# Patient Record
Sex: Female | Born: 1960 | Race: White | Hispanic: No | Marital: Married | Smoking: Never smoker
Health system: Southern US, Community
[De-identification: ages and names within clinical notes are randomized; demographics above are authoritative.]

## PROBLEM LIST (undated history)

## (undated) DIAGNOSIS — G40209 Localization-related (focal) (partial) symptomatic epilepsy and epileptic syndromes with complex partial seizures, not intractable, without status epilepticus: Secondary | ICD-10-CM

## (undated) DIAGNOSIS — M1990 Unspecified osteoarthritis, unspecified site: Secondary | ICD-10-CM

## (undated) DIAGNOSIS — B019 Varicella without complication: Secondary | ICD-10-CM

## (undated) DIAGNOSIS — T7840XA Allergy, unspecified, initial encounter: Secondary | ICD-10-CM

## (undated) DIAGNOSIS — Z9289 Personal history of other medical treatment: Secondary | ICD-10-CM

## (undated) HISTORY — DX: Allergy, unspecified, initial encounter: T78.40XA

## (undated) HISTORY — DX: Varicella without complication: B01.9

## (undated) HISTORY — PX: ABDOMINAL HYSTERECTOMY: SHX81

## (undated) HISTORY — DX: Localization-related (focal) (partial) symptomatic epilepsy and epileptic syndromes with complex partial seizures, not intractable, without status epilepticus: G40.209

## (undated) HISTORY — DX: Personal history of other medical treatment: Z92.89

## (undated) HISTORY — DX: Unspecified osteoarthritis, unspecified site: M19.90

---

## 1992-02-22 HISTORY — PX: OVARIAN CYST REMOVAL: SHX89

## 1998-12-14 ENCOUNTER — Encounter: Payer: Self-pay | Admitting: Obstetrics and Gynecology

## 1998-12-14 ENCOUNTER — Ambulatory Visit (HOSPITAL_COMMUNITY): Admission: RE | Admit: 1998-12-14 | Discharge: 1998-12-14 | Payer: Self-pay | Admitting: Obstetrics and Gynecology

## 2001-12-31 ENCOUNTER — Other Ambulatory Visit: Admission: RE | Admit: 2001-12-31 | Discharge: 2001-12-31 | Payer: Self-pay | Admitting: Obstetrics and Gynecology

## 2003-02-27 ENCOUNTER — Other Ambulatory Visit: Admission: RE | Admit: 2003-02-27 | Discharge: 2003-02-27 | Payer: Self-pay | Admitting: Obstetrics and Gynecology

## 2003-03-05 ENCOUNTER — Ambulatory Visit (HOSPITAL_COMMUNITY): Admission: RE | Admit: 2003-03-05 | Discharge: 2003-03-05 | Payer: Self-pay | Admitting: Obstetrics and Gynecology

## 2004-02-22 HISTORY — PX: PARTIAL HYSTERECTOMY: SHX80

## 2004-02-22 HISTORY — PX: OOPHORECTOMY: SHX86

## 2004-10-07 ENCOUNTER — Other Ambulatory Visit: Admission: RE | Admit: 2004-10-07 | Discharge: 2004-10-07 | Payer: Self-pay | Admitting: Obstetrics and Gynecology

## 2004-11-01 ENCOUNTER — Encounter: Admission: RE | Admit: 2004-11-01 | Discharge: 2004-11-01 | Payer: Self-pay | Admitting: Obstetrics and Gynecology

## 2004-11-11 ENCOUNTER — Encounter: Admission: RE | Admit: 2004-11-11 | Discharge: 2004-11-11 | Payer: Self-pay | Admitting: Obstetrics and Gynecology

## 2005-02-10 ENCOUNTER — Encounter (INDEPENDENT_AMBULATORY_CARE_PROVIDER_SITE_OTHER): Payer: Self-pay | Admitting: Specialist

## 2005-02-10 ENCOUNTER — Inpatient Hospital Stay (HOSPITAL_COMMUNITY): Admission: RE | Admit: 2005-02-10 | Discharge: 2005-02-12 | Payer: Self-pay | Admitting: Obstetrics and Gynecology

## 2005-10-18 ENCOUNTER — Other Ambulatory Visit: Admission: RE | Admit: 2005-10-18 | Discharge: 2005-10-18 | Payer: Self-pay | Admitting: Obstetrics and Gynecology

## 2005-10-26 ENCOUNTER — Ambulatory Visit (HOSPITAL_COMMUNITY): Admission: RE | Admit: 2005-10-26 | Discharge: 2005-10-26 | Payer: Self-pay | Admitting: Obstetrics and Gynecology

## 2006-11-16 ENCOUNTER — Ambulatory Visit (HOSPITAL_COMMUNITY): Admission: RE | Admit: 2006-11-16 | Discharge: 2006-11-16 | Payer: Self-pay | Admitting: Obstetrics and Gynecology

## 2008-07-30 ENCOUNTER — Encounter: Admission: RE | Admit: 2008-07-30 | Discharge: 2008-07-30 | Payer: Self-pay | Admitting: Neurology

## 2008-12-10 ENCOUNTER — Ambulatory Visit (HOSPITAL_COMMUNITY): Admission: RE | Admit: 2008-12-10 | Discharge: 2008-12-10 | Payer: Self-pay | Admitting: Interventional Cardiology

## 2008-12-10 ENCOUNTER — Encounter (INDEPENDENT_AMBULATORY_CARE_PROVIDER_SITE_OTHER): Payer: Self-pay | Admitting: Interventional Cardiology

## 2008-12-10 DIAGNOSIS — Z9289 Personal history of other medical treatment: Secondary | ICD-10-CM

## 2008-12-10 HISTORY — DX: Personal history of other medical treatment: Z92.89

## 2009-03-24 ENCOUNTER — Ambulatory Visit (HOSPITAL_COMMUNITY): Admission: RE | Admit: 2009-03-24 | Discharge: 2009-03-24 | Payer: Self-pay | Admitting: Family Medicine

## 2010-07-09 NOTE — Op Note (Signed)
NAMEAVEN, CEGIELSKI              ACCOUNT NO.:  [id number]   MEDICAL RECORD NO.:  [id number]          PATIENT TYPE:  INP   LOCATION:  9399                          FACILITY:  [facility]   PHYSICIAN:  Janine Limbo, M.D.DATE OF BIRTH:  [birth year]   DATE OF PROCEDURE:  DATE OF DISCHARGE:                                 OPERATIVE REPORT   PREOPERATIVE DIAGNOSES:  1.  A 20-week size fibroid uterus.  2.  Anemia (hemoglobin 11.2).  3.  Abdominal pain.  4.  Questionable right endometrioma.   POSTOPERATIVE DIAGNOSES:  1.  A 16- to 18-week size fibroid uterus.  2.  Anemia (hemoglobin 11.2).  3.  Abdominal pain.  4.  Endometrioma.   PROCEDURE:  1.  Total abdominal hysterectomy.  2.  Right salpingo-oophorectomy.  3.  Left partial salpingectomy.  4.  Ablation of endometriosis.   SURGEON:  Janine Limbo, M.D.   FIRST ASSISTANT:  Hal Morales, M.D.   ANESTHESIA:  General.   DISPOSITION:  Ms. Branscum is a 50 year old female, para 1-0-0-1, who  presents for the above-mentioned diagnoses.  She understands the indications  for her surgical procedure and she accepts the risk of, but not limited to  anesthetic complications, bleeding, infection, and possible damage to the  surrounding organs.   FINDINGS:  The uterus was 59- to 18-week size and contained multiple  fibroids.  There was an 8 cm endometrioma present on right ovary.  There was  extensive endometriosis present on the right fallopian tube, in the  posterior cul-de-sac, particularly along the right uterosacral ligament, in  the anterior cul-de-sac, at the level of the bladder flap, on the left  fallopian tube, and on the left ovary.  There were very small areas of  endometriosis measuring less than 0.2 cm in size on the colon.  The weight  of the uterus was 851 grams.  The left ovary otherwise appeared normal.   DESCRIPTION OF PROCEDURE:  The patient was taken to the operating room where  a general anesthetic was  given.  The patient's abdomen, perineum, and vagina  were prepped with multiple layers of Betadine.  A Foley catheter was placed  in the bladder.  The patient was then sterilely draped.  The midline  incision that was previously made was injected with 0.5% Marcaine with  epinephrine.  A total of 10 mL was used.  An incision was made in the lower  abdomen to the level of the umbilicus.  The prior scar was removed.  The  incision was extended sharply through the subcutaneous tissue, the fascia  and the anterior peritoneum.  An abdominal wall retractor was placed.  The  bowel was packed cephalad.  The uterus was elevated into the operative  field.  The round ligaments were clamped bilaterally, suture ligated, cut,  and then held to the side.  The upper pedicles were skeletonized  bilaterally, clamped, cut, free tied, and suture ligated.  The bladder flap  was developed anteriorly.  Care was taken to remove the endometriosis  present in the anterior cul-de-sac, but not to damage the bladder.  The  uterine arteries were skeletonized bilaterally.  Alternating from left to  right, the uterine arteries, parametrial tissues, paracervical tissues, and  uterosacral ligaments were clamped, cut, sutured and tied securely.  The  ureters had previously been identified and followed throughout their course  in the pelvis.  There was no evidence that the ureters would be damaged as  we did our dissection.  There was no evidence of damage to the ureters  either.  The cervix was then transected from the apex of the vagina.  The  uterus was removed from the operative field.  The apex of the vagina was  closed using figure-of-eight sutures with care to include the vaginal  mucosa.  The pelvis was irrigated.  Hemostasis was achieved using free ties.  We then skeletonized the right infundibulopelvic ligament.  Again, care was  taken not to damage the ureter.  The infundibulopelvic ligament was doubly  clamped,  cut, free tied, and suture ligated.  At this point, hemostasis was  noted to be adequate throughout.  The endometriosis that was present on the  right uterosacral ligament was then partially transected, but also partially  ablated.  Again, care was taken not to damage the ureter.  There was  endometriosis present on the left fallopian tube and therefore, the left  fallopian tube was surgically removed.  The mesosalpinx was sutured using #0  Vicryl.  There was a small area of endometriosis present on the left ovary  and this was ablated.  At this point, there were no other signs of  endometriosis present in the pelvis except for a 0.2 cm area on the bowel.  The decision was made not to ablate or resect this area.  A final check was  made for hemostasis and again, hemostasis was confirmed.  The uterosacral  ligaments were tied to the vaginal angles.  All instruments were removed.  Sponge, needle and instrument counts were correct.  The anterior peritoneum,  the abdominal musculature, and the fascia were closed using a modified Smead-  Jones suture of #0 PDS.  The subcutaneous layer was irrigated.  Hemostasis  was adequate.  The subcutaneous layer was closed using #0 Vicryl.  The skin  was reapproximated using a subcuticular suture of 4-0 Monocryl.  #0 Vicryl  was used except where otherwise mentioned.  Sponge, needle and instrument  counts were correct for a final time.  The estimated blood loss was 75 mL.  The patient tolerated her procedure well.  She was noted to drain clear,  yellow urine at the end of her procedure.  The patient received Toradol 30  mg IV during the operative procedure.  She was to receive antibiotics  preoperatively per protocol.      Janine Limbo, M.D.  Electronically Signed     AVS/MEDQ  D:  02/10/2005  T:  02/10/2005  Job:  [id number]

## 2010-07-09 NOTE — H&P (Signed)
[name], [name]              ACCOUNT NO.:  [id number]   MEDICAL RECORD NO.:  [id number]          PATIENT TYPE:  INP   LOCATION:  NA                            FACILITY:  [facility]   PHYSICIAN:  Janine Limbo, M.D.DATE OF BIRTH:  27-Jan-1961   DATE OF ADMISSION:  DATE OF DISCHARGE:                                HISTORY & PHYSICAL   DATE OF SURGERY:  February 10, 2005   HISTORY OF PRESENT ILLNESS:  [name] is a 50 year old female, para 1-0-  0-1, who presents for a total abdominal hysterectomy and possible right  salpingo-oophorectomy.  The patient has been followed at the [facility] and Gynecology Division of [facility].  She has a known history of large fibroids.  She had an MRI and a CT  scan done of the pelvis that showed a 15.3 x 13.8 cm uterus with multiple  large fibroids.  The largest fibroid measured 7.3 cm by 6.7 cm.  In  addition, it showed a 6.9 x 7.4 cm mass in the right ovary that was most  consistent with an endometrioma.  The patient has a known history of severe  anemia in spite of iron therapy.  She also has pelvic pressure symptoms.  At  this point, she is ready to proceed with definitive therapy.  The patient's  most recent Pap smear was within normal limits and it dates to August of  2006.  The patient has had an endometrial biopsy in the past that was within  normal limits.   OBSTETRIC HISTORY:  The patient had a vaginal delivery in 1994 at term.   PAST MEDICAL HISTORY:  The patient has a history of seizures and she  currently takes Phenobarbital 64.8 mg tablets (two tablets at bedtime).  She  also takes iron and multivitamins.   DRUG ALLERGIES:  NO KNOWN DRUG ALLERGIES.   REVIEW OF SYSTEMS:  Please see history of present illness.   SOCIAL HISTORY:  The patient denies cigarette use, alcohol use, and  recreational drug use.   PHYSICAL EXAMINATION:  VITAL SIGNS:  Weight is 131 pounds.  Height is 5 feet  5  inches.  HEENT:  Within normal limits.  CHEST:  Clear.  HEART:  Regular rate and rhythm.  BREASTS:  Without masses.  ABDOMEN:  Nontender.  There is a firm mass present to the level of the  umbilicus.  EXTREMITIES:  Within normal limits.  NEUROLOGIC EXAMINATION:  Grossly normal.  PELVIC EXAMINATION:  External genitalia is normal.  The vagina is normal.  Cervix is non-tender.  The uterus is 20 weeks' size and non-tender.  Adnexa:  No masses are appreciated.  Rectovaginal exam confirms.   ASSESSMENT:  1.  Large fibroids.  2.  Anemia.  3.  Abdominal pain.  4.  Questionable endometrioma of the right ovary.   PLAN:  The patient will undergo a total abdominal hysterectomy and possible  right salpingo-oophorectomy.  The patient understands the indications for  her surgical procedure, and she accepts the risk of, but not limited to,  anesthetic complications, bleeding, infections,  and possible damage to the  surrounding organs.      Janine Limbo, M.D.  Electronically Signed     AVS/MEDQ  D:  02/07/2005  T:  02/07/2005  Job:  [id number]   cc:   Molly Maduro L. Foy [place], M.D.  Fax: [phone]

## 2010-07-09 NOTE — Discharge Summary (Signed)
**Note Amanda via Obfuscation** Allison, [name]              ACCOUNT NO.:  [id number]   MEDICAL RECORD NO.:  [id number]          PATIENT TYPE:  INP   LOCATION:  9306                          [facility]ACILITY:  [facility]   PHYSICIAN:  Janine Limbo, M.D.DATE OF BIRTH:  Feb 18, 1961   DATE OF ADMISSION:  02/10/2005  DATE OF DISCHARGE:  02/12/2005                                 DISCHARGE SUMMARY   ADMISSION DIAGNOSIS:  1.  Large fibroids.  2.  Anemia.  3.  Abdominal pain.  4.  Questionable endometrioma of the right ovary.   DISCHARGE DIAGNOSIS:  1.  16-18 weeks size fibroid uterus.  2.  Anemia (hemoglobin is 9.4).  3.  Abdominal pain.  4.  Endometrioma of the right ovary.  5.  Extensive endometriosis.   PROCEDURES THIS ADMISSION:  02/10/2005.  1.  Total abdominal hysterectomy.  2.  Right salpingo-oophorectomy.  3.  Left partial salpingectomy.  4.  Ablation of endometriosis.   HISTORY OF PRESENT ILLNESS:  [name] is a 50 year old female who  presents with large fibroids and increasing abdominal pain. The patient has  had heavy bleeding with hemoglobin as low as 8.6. Please see her dictated  history and physical exam for details.   ADMISSION EXAM:  The patient was noted to have a 20-week size fibroid  uterus. A mass had previously been noted in the right pelvis and this was  thought to either be a fibroid or endometrioma.   HOSPITAL COURSE:  On the day of admission, the patient was taken to the  operating room. Operative findings included a 16 18-week size multifibroid  uterus. There was an 8 cm endometrioma present in the right ovary. There was  extensive endometriosis present on the fallopian tubes, in the posterior cul-  de-sac, along the right uterosacral ligament, in the anterior cul-de-sac, on  the left fallopian tube, and on left ovary. The patient's uterus was removed  and the areas of endometriosis were either removed or ablated. The patient  tolerated her procedure well. The patient's  postoperative course was  complicated by a slow return of bowel function. She did tolerate a regular  diet by postoperative day #2, however. She was able to pass flatus by  postoperative day #2. The patient had a temperature to 100.5 on 02/11/2005  at 10 o'clock a.m. However, she quickly defervesced and she remained  afebrile throughout the remainder of her postoperative state. She had  adequate pain control with pain medicines by mouth. The decision was made  that she was an appropriate candidate for discharge.   DISCHARGE INSTRUCTIONS:  The patient will return to see Dr. Stefano Gaul in 6  weeks for follow-up examination. She will refrain from driving for 2 weeks,  heavy lifting for 4 weeks, and intercourse for 6 weeks. She will call for  temperature greater than 100.4 or for severe problems. She was given a copy  of the postoperative instruction sheet as prepared by [facility], division of [facility]. She was given a  prescription for Vicodin and she will take one or two tablets every 4 hours  as needed for pain. She can also take ibuprofen 600 milligrams every 6 hours  as needed for pain. She will take iron 325 milligrams 1 tablet twice each  day for 6 weeks.      Janine Limbo, M.D.  Electronically Signed     AVS/MEDQ  D:  02/12/2005  T:  02/14/2005  Job:  [id number]

## 2012-03-20 ENCOUNTER — Other Ambulatory Visit: Payer: Self-pay | Admitting: Family Medicine

## 2012-03-20 DIAGNOSIS — Z1231 Encounter for screening mammogram for malignant neoplasm of breast: Secondary | ICD-10-CM

## 2012-04-11 ENCOUNTER — Ambulatory Visit
Admission: RE | Admit: 2012-04-11 | Discharge: 2012-04-11 | Disposition: A | Discharge status: Home / Self Care | Payer: Commercial Indemnity | Source: Ambulatory Visit | Attending: Family Medicine | Admitting: Family Medicine

## 2012-04-11 DIAGNOSIS — Z1231 Encounter for screening mammogram for malignant neoplasm of breast: Secondary | ICD-10-CM

## 2012-06-03 ENCOUNTER — Emergency Department (HOSPITAL_BASED_OUTPATIENT_CLINIC_OR_DEPARTMENT_OTHER)
Admission: EM | Admit: 2012-06-03 | Discharge: 2012-06-04 | Disposition: A | Discharge status: Home / Self Care | Payer: Commercial Indemnity | Attending: Emergency Medicine | Admitting: Emergency Medicine

## 2012-06-03 ENCOUNTER — Encounter (HOSPITAL_BASED_OUTPATIENT_CLINIC_OR_DEPARTMENT_OTHER): Payer: Self-pay | Admitting: *Deleted

## 2012-06-03 ENCOUNTER — Other Ambulatory Visit: Payer: Self-pay

## 2012-06-03 DIAGNOSIS — R112 Nausea with vomiting, unspecified: Secondary | ICD-10-CM | POA: Insufficient documentation

## 2012-06-03 DIAGNOSIS — R197 Diarrhea, unspecified: Secondary | ICD-10-CM | POA: Insufficient documentation

## 2012-06-03 DIAGNOSIS — Z7982 Long term (current) use of aspirin: Secondary | ICD-10-CM | POA: Insufficient documentation

## 2012-06-03 DIAGNOSIS — Z79899 Other long term (current) drug therapy: Secondary | ICD-10-CM | POA: Insufficient documentation

## 2012-06-03 DIAGNOSIS — Z8669 Personal history of other diseases of the nervous system and sense organs: Secondary | ICD-10-CM | POA: Insufficient documentation

## 2012-06-03 DIAGNOSIS — R109 Unspecified abdominal pain: Secondary | ICD-10-CM

## 2012-06-03 DIAGNOSIS — R748 Abnormal levels of other serum enzymes: Secondary | ICD-10-CM

## 2012-06-03 NOTE — ED Notes (Addendum)
Pt c/o abd pain, N/V/D onset earlier today. Actively vomiting at triage. States has been on antibiotic since Friday and thought that might be the problem. Points to epigastric region and states pain goes through to back. Also c/o dizziness. EKG being done at triage.

## 2012-06-03 NOTE — ED Notes (Signed)
I placed patient on wall monitor after getting portable ECG. Pa Sophia viewed ECG before i left copy on Dr.'s desk.

## 2012-06-04 ENCOUNTER — Encounter (HOSPITAL_BASED_OUTPATIENT_CLINIC_OR_DEPARTMENT_OTHER): Payer: Self-pay

## 2012-06-04 ENCOUNTER — Emergency Department (HOSPITAL_BASED_OUTPATIENT_CLINIC_OR_DEPARTMENT_OTHER): Payer: Commercial Indemnity

## 2012-06-04 LAB — COMPREHENSIVE METABOLIC PANEL
ALT: 155 U/L — ABNORMAL HIGH (ref 0–35)
AST: 209 U/L — ABNORMAL HIGH (ref 0–37)
Albumin: 4.1 g/dL (ref 3.5–5.2)
Alkaline Phosphatase: 132 U/L — ABNORMAL HIGH (ref 39–117)
CO2: 28 mEq/L (ref 19–32)
Chloride: 103 mEq/L (ref 96–112)
GFR calc non Af Amer: >90 mL/min (ref >90)
Potassium: 4.1 mEq/L (ref 3.5–5.1)
Sodium: 139 mEq/L (ref 135–145)
Total Bilirubin: 0.5 mg/dL (ref 0.3–1.2)

## 2012-06-04 LAB — CBC WITH DIFFERENTIAL/PLATELET
Basophils Absolute: 0 10*3/uL (ref 0.0–0.1)
Basophils Relative: 0 % (ref 0–1)
HCT: 38.5 % (ref 36.0–46.0)
Lymphocytes Relative: 4 % — ABNORMAL LOW (ref 12–46)
MCHC: 34 g/dL (ref 30.0–36.0)
Neutro Abs: 12.2 10*3/uL — ABNORMAL HIGH (ref 1.7–7.7)
Neutrophils Relative %: 92 % — ABNORMAL HIGH (ref 43–77)
RDW: 12.4 % (ref 11.5–15.5)
WBC: 13.3 10*3/uL — ABNORMAL HIGH (ref 4.0–10.5)

## 2012-06-04 MED ORDER — OXYCODONE-ACETAMINOPHEN 5-325 MG PO TABS: 1.0000 | ORAL_TABLET | Freq: Four times a day (QID) | ORAL | Status: DC | PRN

## 2012-06-04 MED ORDER — SODIUM CHLORIDE 0.9 % IV BOLUS (SEPSIS)
500.0000 mL | Freq: Once | INTRAVENOUS | Status: AC
  Administered 2012-06-04: 500 mL via INTRAVENOUS

## 2012-06-04 MED ORDER — MORPHINE SULFATE 4 MG/ML IJ SOLN
4.0000 mg | Freq: Once | INTRAMUSCULAR | Status: AC
  Administered 2012-06-04: 4 mg via INTRAVENOUS
  Filled 2012-06-04: qty 1

## 2012-06-04 MED ORDER — ONDANSETRON HCL 4 MG/2ML IJ SOLN
4.0000 mg | Freq: Once | INTRAMUSCULAR | Status: AC
  Administered 2012-06-04: 4 mg via INTRAVENOUS
  Filled 2012-06-04: qty 2

## 2012-06-04 MED ORDER — IOHEXOL 300 MG/ML  SOLN
50.0000 mL | Freq: Once | INTRAMUSCULAR | Status: AC | PRN
  Administered 2012-06-04: 50 mL via ORAL

## 2012-06-04 MED ORDER — IOHEXOL 300 MG/ML  SOLN
100.0000 mL | Freq: Once | INTRAMUSCULAR | Status: AC | PRN
  Administered 2012-06-04: 100 mL via INTRAVENOUS

## 2012-06-04 MED ORDER — ONDANSETRON 8 MG PO TBDP: 8.0000 mg | ORAL_TABLET | Freq: Three times a day (TID) | ORAL | Status: DC | PRN

## 2012-06-04 MED ORDER — FENTANYL CITRATE 0.05 MG/ML IJ SOLN
100.0000 ug | Freq: Once | INTRAMUSCULAR | Status: AC
  Administered 2012-06-04: 100 ug via INTRAVENOUS
  Filled 2012-06-04: qty 2

## 2012-06-04 NOTE — Discharge Instructions (Signed)
Follow with your doctor to get a dynamic liver protocol MRI. You also have cysts on your left ovary and that will need an ultrasound.  Abdominal Pain Abdominal pain can be caused by many things. Your caregiver decides the seriousness of your pain by an examination and possibly blood tests and X-rays. Many cases can be observed and treated at home. Most abdominal pain is not caused by a disease and will probably improve without treatment. However, in many cases, more time must pass before a clear cause of the pain can be found. Before that point, it may not be known if you need more testing, or if hospitalization or surgery is needed. HOME CARE INSTRUCTIONS   Do not take laxatives unless directed by your caregiver.  Take pain medicine only as directed by your caregiver.  Only take over-the-counter or prescription medicines for pain, discomfort, or fever as directed by your caregiver.  Try a clear liquid diet (broth, tea, or water) for as long as directed by your caregiver. Slowly move to a bland diet as tolerated. SEEK IMMEDIATE MEDICAL CARE IF:   The pain does not go away.  You have a fever.  You keep throwing up (vomiting).  The pain is felt only in portions of the abdomen. Pain in the right side could possibly be appendicitis. In an adult, pain in the left lower portion of the abdomen could be colitis or diverticulitis.  You pass bloody or black tarry stools. MAKE SURE YOU:   Understand these instructions.  Will watch your condition.  Will get help right away if you are not doing well or get worse. Document Released: 11/17/2004 Document Revised: 05/02/2011 Document Reviewed: 09/26/2007 [facility] Patient Information 2013 [place], [place].

## 2012-06-04 NOTE — ED Notes (Signed)
Patient transported to CT 

## 2012-06-04 NOTE — ED Notes (Signed)
MD at bedside with bedside ultrasound machine

## 2012-06-04 NOTE — ED Provider Notes (Signed)
History     CSN: [id number]  Arrival date & time 06/03/12  2334   First MD Initiated Contact with Patient 06/03/12 2359      Chief Complaint  Patient presents with  . Abdominal Pain    (Consider location/radiation/quality/duration/timing/severity/associated sxs/prior treatment) Patient is a 52 y.o. female presenting with abdominal pain. The history is provided by the patient.  Abdominal Pain Associated symptoms: diarrhea, nausea and vomiting   Associated symptoms: no chest pain and no shortness of breath    patient resents with upper abdominal pain. It is dull and somewhat crampy. She's also had nausea and vomiting with some diarrhea. No fevers. She's had a decreased appetite. She has several family members that have had GI symptoms, however it was approximately 3 weeks ago. No lightheadedness. States the pain is dull and does radiate to her chest a little bit. It began today. The pain is constant patient has been on Augmentin since Friday for a sinus infection. She states her sinus pressure has been there for 3 weeks but has improved.  Past Medical History  Diagnosis Date  . Seizures     Past Surgical History  Procedure Laterality Date  . Abdominal hysterectomy      History reviewed. No pertinent family history.  History  Substance Use Topics  . Smoking status: Never Smoker   . Smokeless tobacco: Not on file  . Alcohol Use: No    OB History   Grav Para Term Preterm Abortions TAB SAB Ect Mult Living                  Review of Systems  Constitutional: Positive for appetite change. Negative for activity change.  HENT: Positive for sinus pressure. Negative for neck stiffness.   Eyes: Negative for pain.  Respiratory: Negative for chest tightness and shortness of breath.   Cardiovascular: Negative for chest pain and leg swelling.  Gastrointestinal: Positive for nausea, vomiting, abdominal pain and diarrhea.  Genitourinary: Negative for flank pain.  Musculoskeletal:  Negative for back pain.  Skin: Negative for rash.  Neurological: Negative for weakness, numbness and headaches.  Psychiatric/Behavioral: Negative for behavioral problems.    Allergies  Review of patient's allergies indicates no known allergies.  Home Medications   Current Outpatient Rx  Name  Route  Sig  Dispense  Refill  . amoxicillin-clavulanate (AUGMENTIN) 875-125 MG per tablet   Oral   Take 1 tablet by mouth 2 (two) times daily.         Marland Kitchen aspirin 81 MG tablet   Oral   Take 81 mg by mouth daily.         Marland Kitchen PHENobarbital (LUMINAL) 64.8 MG tablet   Oral   Take 64.8 mg by mouth 2 (two) times daily.         . ondansetron (ZOFRAN-ODT) 8 MG disintegrating tablet   Oral   Take 1 tablet (8 mg total) by mouth every 8 (eight) hours as needed for nausea.   20 tablet   0   . oxyCODONE-acetaminophen (PERCOCET/ROXICET) 5-325 MG per tablet   Oral   Take 1-2 tablets by mouth every 6 (six) hours as needed for pain.   15 tablet   0     BP 116/65  Pulse 106  Temp(Src) 98.3 F (36.8 C) (Oral)  Resp 16  Ht 5\' 4"  (1.626 m)  Wt 142 lb (64.411 kg)  BMI 24.36 kg/m2  SpO2 100%  Physical Exam  Nursing note and vitals reviewed. Constitutional: She is oriented  to person, place, and time. She appears well-developed and well-nourished.  HENT:  Head: Normocephalic and atraumatic.  Eyes: EOM are normal. Pupils are equal, round, and reactive to light.  Neck: Normal range of motion. Neck supple.  Cardiovascular: Regular rhythm and normal heart sounds.   No murmur heard. Tachycardia  Pulmonary/Chest: Effort normal and breath sounds normal. No respiratory distress. She has no wheezes. She has no rales.  Abdominal: Soft. Bowel sounds are normal. She exhibits no distension. There is tenderness. There is no rebound and no guarding.  Upper abdominal tenderness. Mild firmness. No distention.  Musculoskeletal: Normal range of motion.  Neurological: She is alert and oriented to person,  place, and time. No cranial nerve deficit.  Skin: Skin is warm and dry.  Psychiatric: She has a normal mood and affect. Her speech is normal.    ED Course  Procedures (including critical care time)  Labs Reviewed  CBC WITH DIFFERENTIAL - Abnormal; Notable for the following:    WBC 13.3 (*)    Neutrophils Relative 92 (*)    Neutro Abs 12.2 (*)    Lymphocytes Relative 4 (*)    Lymphs Abs 0.5 (*)    All other components within normal limits  COMPREHENSIVE METABOLIC PANEL - Abnormal; Notable for the following:    Glucose, Bld 159 (*)    AST 209 (*)    ALT 155 (*)    Alkaline Phosphatase 132 (*)    All other components within normal limits  LIPASE, BLOOD   Ct Abdomen Pelvis W Contrast  06/04/2012  *RADIOLOGY REPORT*  Clinical Data: Abdominal pain, nausea, vomiting and diarrhea. Elevated LFTs.  Leukocytosis.  CT ABDOMEN AND PELVIS WITH CONTRAST  Technique:  Multidetector CT imaging of the abdomen and pelvis was performed following the standard protocol during bolus administration of intravenous contrast.  Contrast: 100 mL of Omnipaque 300 IV contrast  Comparison: CT of the pelvis performed 11/01/2004, MRI of the pelvis performed 11/11/2004 and abdominal ultrasound performed 03/28/2012  Findings: The visualized lung bases are clear.  A vague 3.0 cm focus of decreased attenuation is noted within the anterior right hepatic lobe adjacent to the gallbladder fossa.  The appearance is somewhat atypical for fatty infiltration, and a focal mass cannot be excluded.  The liver is otherwise grossly unremarkable in appearance.  The spleen is within normal limits. An apparent large 1.9 cm stone is noted within the gallbladder; the gallbladder is otherwise unremarkable in appearance.  The pancreas and adrenal glands are unremarkable.  Slightly prominent nodes are noted about the hepatic hilum.  The kidneys are unremarkable in appearance.  There is no evidence of hydronephrosis.  No renal or ureteral stones are  seen.  No perinephric stranding is appreciated.  No free fluid is identified.  The small bowel is unremarkable in appearance.  The stomach is within normal limits.  No acute vascular abnormalities are seen.  The appendix is normal in caliber, without evidence for appendicitis.  The colon is grossly unremarkable in appearance.  The bladder is mildly distended and grossly unremarkable.  The patient is status post hysterectomy.  Scattered cystic lesions are noted about the left ovary, measuring up to 6.8 x 3.1 cm in size. No suspicious adnexal masses are seen.  No inguinal lymphadenopathy is seen.  No acute osseous abnormalities are identified.  IMPRESSION:  1.  Vague 3.0 cm focus of decreased attenuation within the anterior right hepatic lobe adjacent to the gallbladder fossa.  The appearance is somewhat atypical for fatty  infiltration, and a focal mass cannot be excluded.  Dynamic liver protocol MRI or CT would be helpful for further evaluation; an MRI could be performed when the patient is able to hold her breath for the study. 2.  Large stone again noted within the gallbladder; gallbladder otherwise unremarkable in appearance. 3.  Slightly prominent nodes about the hepatic hilum, of uncertain significance. 4.  Scattered cystic lesions about the left ovary, measuring up to 6.8 x 3.1 cm in size.  Though these may be physiologic, pelvic ultrasound is recommended for further evaluation on an elective non- emergent basis.   Original Report Authenticated By: Tonia Ghent, M.D.    Dg Abd Acute W/chest  06/04/2012  *RADIOLOGY REPORT*  Clinical Data: Abdominal pain.  Nausea, vomiting, and diarrhea.  ACUTE ABDOMEN SERIES (ABDOMEN 2 VIEW & CHEST 1 VIEW)  Comparison: None.  Findings: Heart size and pulmonary vascularity are normal.  Slight scarring at the lung apices.  Lungs are otherwise clear.  No free air in the abdomen.  No dilated loops of large or small bowel.  No osseous abnormality.  IMPRESSION: Benign-appearing  abdomen and chest.   Original Report Authenticated By: Francene Boyers, M.D.      1. Abdominal pain   2. Elevated liver enzymes      Date: 06/04/2012  Rate: 122  Rhythm: sinus tachycardia  QRS Axis: normal  Intervals: normal  ST/T Wave abnormalities: normal  Conduction Disutrbances:left anterior fascicular block  Narrative Interpretation:   Old EKG Reviewed: none available    MDM  Patient presents with nausea vomiting diarrhea and abdominal pain. Had a recent ultrasound 2 months ago that showed gallstones. LFTs are elevated mildly. CT scan shows possible lesion in the liver. Patient feels better as tolerated orals here. She has a primary care Dr. to followup for an outpatient MRI. She also has cyst on ovary on the day of all by ultrasound. Patient begin pain medicines and antiemetics and discharged home        Juliet Rude. Rubin Payor, MD 06/04/12 [phone]

## 2012-06-12 ENCOUNTER — Other Ambulatory Visit: Payer: Self-pay | Admitting: Family Medicine

## 2012-06-12 DIAGNOSIS — R935 Abnormal findings on diagnostic imaging of other abdominal regions, including retroperitoneum: Secondary | ICD-10-CM

## 2012-06-15 ENCOUNTER — Ambulatory Visit
Admission: RE | Admit: 2012-06-15 | Discharge: 2012-06-15 | Disposition: A | Discharge status: Home / Self Care | Payer: Commercial Indemnity | Source: Ambulatory Visit | Attending: Family Medicine | Admitting: Family Medicine

## 2012-06-15 DIAGNOSIS — R935 Abnormal findings on diagnostic imaging of other abdominal regions, including retroperitoneum: Secondary | ICD-10-CM

## 2012-06-15 MED ORDER — GADOBENATE DIMEGLUMINE 529 MG/ML IV SOLN
13.0000 mL | Freq: Once | INTRAVENOUS | Status: AC | PRN
  Administered 2012-06-15: 13 mL via INTRAVENOUS

## 2012-08-27 ENCOUNTER — Other Ambulatory Visit: Payer: Self-pay | Admitting: Neurology

## 2012-11-23 ENCOUNTER — Ambulatory Visit (INDEPENDENT_AMBULATORY_CARE_PROVIDER_SITE_OTHER): Payer: Managed Care, Other (non HMO) | Admitting: Nurse Practitioner

## 2012-11-23 ENCOUNTER — Encounter: Payer: Self-pay | Admitting: Nurse Practitioner

## 2012-11-23 VITALS — BP 120/71 | HR 96 | Ht 65.0 in | Wt 140.0 lb

## 2012-11-23 DIAGNOSIS — Z79899 Other long term (current) drug therapy: Secondary | ICD-10-CM

## 2012-11-23 DIAGNOSIS — Z5181 Encounter for therapeutic drug level monitoring: Secondary | ICD-10-CM | POA: Insufficient documentation

## 2012-11-23 DIAGNOSIS — G40109 Localization-related (focal) (partial) symptomatic epilepsy and epileptic syndromes with simple partial seizures, not intractable, without status epilepticus: Secondary | ICD-10-CM

## 2012-11-23 MED ORDER — PHENOBARBITAL 64.8 MG PO TABS: ORAL_TABLET | ORAL | Status: DC

## 2012-11-23 NOTE — Patient Instructions (Addendum)
Will check labs today Continue phenobarbital dose Followup yearly and when necessary Call for any seizure activity

## 2012-11-23 NOTE — Progress Notes (Signed)
I have read the note, and I agree with the clinical assessment and plan.  WILLIS,CHARLES KEITH   

## 2012-11-23 NOTE — Progress Notes (Signed)
[facility]  PATIENT: [name] DOB: [birth year]   REASON FOR VISIT: Followup seizure disorder   HISTORY OF PRESENT ILLNESS: [name] ,52 year old white female returns today for followup. 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 She has been well-controlled on phenobarbital. She denies any side effects to the medication. Appetite is reportedly good, sleeping well, no falls. She has a history of osteoarthritis of the knees particularly the left. She also had a hysterectomy a couple of years ago, she had a DEXA scan (2010) which returned normal. She intermittently exercises, in the winter she does nothing, in the spring and summer she is much more active . No seizure activity in many  years. She was seen in the ER back in the spring for abdominal pain, ultrasound detected a spot on her liver and she had elevated enzymes at that time. She claims repeat ultrasound done and the spot had decreased in size. She has no new neurologic complaints.  REVIEW OF SYSTEMS: Full 14 system review of systems performed and notable only for:  Constitutional: N/A  Cardiovascular: N/A  Ear/Nose/Throat: N/A  Skin: N/A  Eyes: N/A  Respiratory: N/A  Gastroitestinal: N/A  Hematology/Lymphatic: N/A  Endocrine: N/A Musculoskeletal:N/A  Allergy/Immunology: N/A  Neurological: N/A Psychiatric: Anxiety, decreased energy  ALLERGIES: No Known Allergies  HOME MEDICATIONS: Outpatient Prescriptions Prior to Visit  Medication Sig Dispense Refill  . aspirin 81 MG tablet Take 81 mg by mouth daily.      Marland Kitchen PHENobarbital (LUMINAL) 64.8 MG tablet TAKE 2 TABLETS BY MOUTH EVERY NIGHT AT BEDTIME  60 tablet  3  . amoxicillin-clavulanate (AUGMENTIN) 875-125 MG per tablet Take 1 tablet by mouth 2 (two) times daily.      . ondansetron (ZOFRAN-ODT) 8 MG disintegrating tablet Take 1 tablet (8 mg total) by mouth every 8 (eight) hours as needed for nausea.  20 tablet  0  .  oxyCODONE-acetaminophen (PERCOCET/ROXICET) 5-325 MG per tablet Take 1-2 tablets by mouth every 6 (six) hours as needed for pain.  15 tablet  0   No facility-administered medications prior to visit.    PAST MEDICAL HISTORY: Past Medical History  Diagnosis Date  . Seizures     PAST SURGICAL HISTORY: Past Surgical History  Procedure Laterality Date  . Abdominal hysterectomy      FAMILY HISTORY: History reviewed. No pertinent family history.  SOCIAL HISTORY: History   Social History  . Marital Status: Married    Spouse Name: N/A    Number of Children: 1  . Years of Education: hs   Occupational History  . Not on file.   Social History Main Topics  . Smoking status: Never Smoker   . Smokeless tobacco: Never Used  . Alcohol Use: No  . Drug Use: No  . Sexual Activity: Not on file   Other Topics Concern  . Not on file   Social History Narrative   Patient lives at home with husband [name].    Patient has 1 child.    Patient has a high school education.            PHYSICAL EXAM  Filed Vitals:   11/23/12 1008  BP: 120/71  Pulse: 96  Height: 5\' 5"  (1.651 m)  Weight: 140 lb (63.504 kg)   Body mass index is 23.3 kg/(m^2).  Generalized: Well developed, in no acute distress  Head: normocephalic and atraumatic,. Oropharynx benign  Neck: Supple, no carotid bruits  Cardiac: Regular rate rhythm,  no murmur    Neurological examination   Mentation: Alert oriented to time, place, history taking. Follows all commands speech and language fluent  Cranial nerve II-XII: Pupils were equal round reactive to light extraocular movements were full, visual field were full on confrontational test. Facial sensation and strength were normal. hearing was intact to finger rubbing bilaterally. Uvula tongue midline. head turning and shoulder shrug and were normal and symmetric.Tongue protrusion into cheek strength was normal. Motor: normal bulk and tone, full strength in the BUE, BLE,  fine finger movements normal, no pronator drift. No focal weakness Coordination: finger-nose-finger, heel-to-shin bilaterally, no dysmetria Reflexes: Brachioradialis 2/2, biceps 2/2, triceps 2/2, patellar 2/2, Achilles 2/2, plantar responses were flexor bilaterally. Gait and Station: Rising up from seated position without assistance, normal stance,  moderate stride, good arm swing, smooth turning, able to perform tiptoe, and heel walking without difficulty. Tandem gait stable  DIAGNOSTIC DATA (LABS, IMAGING, TESTING) - I reviewed patient records, labs, notes, testing and imaging myself where available.  Lab Results  Component Value Date   WBC 13.3* 06/04/2012   HGB 13.1 06/04/2012   HCT 38.5 06/04/2012   MCV 87.1 06/04/2012   PLT 207 06/04/2012      Component Value Date/Time   NA 139 06/04/2012 0035   K 4.1 06/04/2012 0035   CL 103 06/04/2012 0035   CO2 28 06/04/2012 0035   GLUCOSE 159* 06/04/2012 0035   BUN 12 06/04/2012 0035   CREATININE 0.70 06/04/2012 0035   CALCIUM 9.4 06/04/2012 0035   PROT 7.5 06/04/2012 0035   ALBUMIN 4.1 06/04/2012 0035   AST 209* 06/04/2012 0035   ALT 155* 06/04/2012 0035   ALKPHOS 132* 06/04/2012 0035   BILITOT 0.5 06/04/2012 0035   GFRNONAA >90 06/04/2012 0035   GFRAA >90 06/04/2012 0035     ASSESSMENT AND PLAN  52 y.o. year old female  has a past medical history of Seizures. here for followup. She has had no seizure activity in many years. She is currently on phenobarbital tolerating it without side effects.  Will check labs today, CBC, CMP and PB level Continue phenobarbital dose will renew Followup yearly and when necessary Call for any seizure activity Nilda Riggs, [facility], [facility], APRN  [facility] [address] [place], [place] [address] [phone]

## 2012-11-24 LAB — COMPREHENSIVE METABOLIC PANEL
AST: 20 IU/L (ref 0–40)
Albumin/Globulin Ratio: 2 (ref 1.1–2.5)
Alkaline Phosphatase: 124 IU/L — ABNORMAL HIGH (ref 39–117)
BUN/Creatinine Ratio: 9 (ref 9–23)
CO2: 27 mmol/L (ref 18–29)
Creatinine, Ser: 0.8 mg/dL (ref 0.57–1.00)
GFR calc non Af Amer: 85 mL/min/{1.73_m2} (ref >59)
Globulin, Total: 2.3 g/dL (ref 1.5–4.5)
Potassium: 4.6 mmol/L (ref 3.5–5.2)
Sodium: 144 mmol/L (ref 134–144)

## 2012-11-24 LAB — CBC WITH DIFFERENTIAL/PLATELET
Eos: 3 %
Eosinophils Absolute: 0.1 10*3/uL (ref 0.0–0.4)
HCT: 39.7 % (ref 34.0–46.6)
Immature Granulocytes: 0 %
Lymphocytes Absolute: 1.3 10*3/uL (ref 0.7–3.1)
MCH: 28.2 pg (ref 26.6–33.0)
MCV: 88 fL (ref 79–97)
Monocytes Absolute: 0.3 10*3/uL (ref 0.1–0.9)
RDW: 13.6 % (ref 12.3–15.4)
WBC: 4.9 10*3/uL (ref 3.4–10.8)

## 2012-11-29 NOTE — Progress Notes (Signed)
Quick Note:  I called and spoke to pt and relayed that labs looked good. She verbalized understanding. ______

## 2013-05-28 ENCOUNTER — Other Ambulatory Visit: Payer: Self-pay

## 2013-05-28 MED ORDER — PHENOBARBITAL 64.8 MG PO TABS: ORAL_TABLET | ORAL | Status: DC

## 2013-05-28 NOTE — Telephone Encounter (Signed)
Rx signed and faxed.

## 2013-11-19 ENCOUNTER — Telehealth: Payer: Self-pay | Admitting: *Deleted

## 2013-11-19 NOTE — Telephone Encounter (Signed)
Patient returned my call, appointment was r/s for 11/25/13 at 1:30 pm with NP MM.

## 2013-11-19 NOTE — Telephone Encounter (Signed)
Calling patient to r/s appointment time with NP MM, due to NP CM admin time, patient can r/s with [facility] as an OVER flow pt.

## 2013-11-25 ENCOUNTER — Ambulatory Visit (INDEPENDENT_AMBULATORY_CARE_PROVIDER_SITE_OTHER): Payer: Managed Care, Other (non HMO) | Admitting: Adult Health

## 2013-11-25 ENCOUNTER — Encounter: Payer: Self-pay | Admitting: Adult Health

## 2013-11-25 ENCOUNTER — Encounter (INDEPENDENT_AMBULATORY_CARE_PROVIDER_SITE_OTHER): Payer: Self-pay

## 2013-11-25 VITALS — BP 113/70 | HR 67 | Ht 65.0 in | Wt 139.0 lb

## 2013-11-25 DIAGNOSIS — Z5181 Encounter for therapeutic drug level monitoring: Secondary | ICD-10-CM

## 2013-11-25 DIAGNOSIS — G40109 Localization-related (focal) (partial) symptomatic epilepsy and epileptic syndromes with simple partial seizures, not intractable, without status epilepticus: Secondary | ICD-10-CM

## 2013-11-25 MED ORDER — PHENOBARBITAL 64.8 MG PO TABS: ORAL_TABLET | ORAL | Status: DC

## 2013-11-25 NOTE — Patient Instructions (Signed)
Seizure, Adult A seizure means there is unusual activity in the brain. A seizure can cause changes in attention or behavior. Seizures often cause shaking (convulsions). Seizures often last from 30 seconds to 2 minutes. HOME CARE   If you are given medicines, take them exactly as told by your doctor.  Keep all doctor visits as told.  Do not swim or drive until your doctor says it is okay.  Teach others what to do if you have a seizure. They should:  Lay you on the ground.  Put a cushion under your head.  Loosen any tight clothing around your neck.  Turn you on your side.  Stay with you until you get better. GET HELP RIGHT AWAY IF:   The seizure lasts longer than 2 to 5 minutes.  The seizure is very bad.  The person does not wake up after the seizure.  The person's attention or behavior changes. Drive the person to the emergency room or call your local emergency services (911 in [place] MAKE SURE YOU:   Understand these instructions.  Will watch your condition.  Will get help right away if you are not doing well or get worse. Document Released: 07/27/2007 Document Revised: 05/02/2011 Document Reviewed: 01/26/2011 ExitCare Patient Information 2015 [employer]. This information is not intended to replace advice given to you by your health care provider. Make sure you discuss any questions you have with your health care provider.  

## 2013-11-25 NOTE — Progress Notes (Signed)
PATIENT: [name] DOB: 29-Feb-1960  REASON FOR VISIT: follow up HISTORY FROM: patient  HISTORY OF PRESENT ILLNESS: [name] is a 53 year old female with a history of seizures. She returns today for follow-up. She is currently taking phenobarbital. She reports that her last seizure was 1-2 years ago. She states that she normally does not have any seizures until she is sick. She does not to operate a motor vehicle not because of the seizures but rather due to anxiety when driving. She states that she remains active. No new neurological complaints. No new medical issues since last seen.   HISTORY 11/23/12 (CM): 53 year old white female returns today for followup. She has a history of epilepsy since the age of 61 months. She has been well-controlled on phenobarbital. She denies any side effects to the medication. Appetite is reportedly good, sleeping well, no falls. She has a history of osteoarthritis of the knees particularly the left. She also had a hysterectomy a couple of years ago, she had a DEXA scan (2010) which returned normal. She intermittently exercises, in the winter she does nothing, in the spring and summer she is much more active . No seizure activity in many years. She was seen in the ER back in the spring for abdominal pain, ultrasound detected a spot on her liver and she had elevated enzymes at that time. She claims repeat ultrasound done and the spot had decreased in size. She has no new neurologic complaints.  REVIEW OF SYSTEMS: Full 14 system review of systems performed and notable only for:  Constitutional: N/A  Eyes: N/A Ear/Nose/Throat: N/A  Skin: N/A  Cardiovascular: N/A  Respiratory: N/A  Gastrointestinal: N/A  Genitourinary: N/A Hematology/Lymphatic: N/A  Endocrine: N/A Musculoskeletal:joint pain, joint swelling, aching muscles  Allergy/Immunology: N/A  Neurological: N/A Psychiatric: nervous/anxious Sleep: N/A   ALLERGIES: No Known Allergies  HOME  MEDICATIONS: Outpatient Prescriptions Prior to Visit  Medication Sig Dispense Refill  . aspirin 81 MG tablet Take 81 mg by mouth daily.      . fluticasone (FLONASE) 50 MCG/ACT nasal spray as needed.      Marland Kitchen PHENobarbital (LUMINAL) 64.8 MG tablet 2 at hs  60 tablet  5   No facility-administered medications prior to visit.    PAST MEDICAL HISTORY: Past Medical History  Diagnosis Date  . Seizures     PAST SURGICAL HISTORY: Past Surgical History  Procedure Laterality Date  . Abdominal hysterectomy      FAMILY HISTORY: No family history on file.  SOCIAL HISTORY: History   Social History  . Marital Status: Married    Spouse Name: N/A    Number of Children: 1  . Years of Education: hs   Occupational History  . Not on file.   Social History Main Topics  . Smoking status: Never Smoker   . Smokeless tobacco: Never Used  . Alcohol Use: No  . Drug Use: No  . Sexual Activity: Not on file   Other Topics Concern  . Not on file   Social History Narrative   Patient lives at home with husband [name].    Patient has 1 child.    Patient has a high school education.             PHYSICAL EXAM  Filed Vitals:   11/25/13 1331  BP: 113/70  Pulse: 67  Height: 5\' 5"  (1.651 m)  Weight: 139 lb (63.05 kg)   Body mass index is 23.13 kg/(m^2). Generalized: Well developed, in  no acute distress   Neurological examination  Mentation: Alert oriented to time, place, history taking. Follows all commands speech and language fluent Cranial nerve II-XII: Pupils were equal round reactive to light. Extraocular movements were full, visual field were full on confrontational test. Facial sensation and strength were normal. Uvula tongue midline. Head turning and shoulder shrug  were normal and symmetric. Motor: The motor testing reveals 5 over 5 strength of all 4 extremities. Good symmetric motor tone is noted throughout.  Sensory: Sensory testing is intact to soft touch on all 4 extremities.  No evidence of extinction is noted.  Coordination: Cerebellar testing reveals good finger-nose-finger and heel-to-shin bilaterally.  Gait and station: Gait is normal. Tandem gait is normal. Romberg is negative. No drift is seen.  Reflexes: Deep tendon reflexes are symmetric and normal bilaterally.     DIAGNOSTIC DATA (LABS, IMAGING, TESTING) - I reviewed patient records, labs, notes, testing and imaging myself where available.  Lab Results  Component Value Date   WBC 4.9 11/23/2012   HGB 12.7 11/23/2012   HCT 39.7 11/23/2012   MCV 88 11/23/2012   PLT 207 06/04/2012      Component Value Date/Time   NA 144 11/23/2012 1032   NA 139 06/04/2012 0035   K 4.6 11/23/2012 1032   CL 102 11/23/2012 1032   CO2 27 11/23/2012 1032   GLUCOSE 95 11/23/2012 1032   GLUCOSE 159* 06/04/2012 0035   BUN 7 11/23/2012 1032   BUN 12 06/04/2012 0035   CREATININE 0.80 11/23/2012 1032   CALCIUM 9.7 11/23/2012 1032   PROT 6.9 11/23/2012 1032   PROT 7.5 06/04/2012 0035   ALBUMIN 4.1 06/04/2012 0035   AST 20 11/23/2012 1032   ALT 21 11/23/2012 1032   ALKPHOS 124* 11/23/2012 1032   BILITOT 0.2 11/23/2012 1032   GFRNONAA 85 11/23/2012 1032   GFRAA 98 11/23/2012 1032    ASSESSMENT AND PLAN 53 y.o. year old female  has a past medical history of Seizures. here with:  1. Seizures  Patient's seizures have been controlled on phenobarbital. Continue phenobarbital, will refill today. We'll check blood work today-CBC, CMP and phenobarbital level Followup in one year or sooner if needed.  Ward Givens, MSN, NP-C 11/25/2013, 1:10 PM [facility] [address], [place], [place] [address] [phone]  Note: This document was prepared with digital dictation and possible smart phrase technology. Any transcriptional errors that result from this process are unintentional.

## 2013-11-25 NOTE — Progress Notes (Signed)
I have read the note, and I agree with the clinical assessment and plan.  WILLIS,CHARLES KEITH   

## 2013-11-26 LAB — COMPREHENSIVE METABOLIC PANEL
ALBUMIN: 5 g/dL (ref 3.5–5.5)
ALT: 26 IU/L (ref 0–32)
AST: 24 IU/L (ref 0–40)
Albumin/Globulin Ratio: 1.9 (ref 1.1–2.5)
Alkaline Phosphatase: 139 IU/L — ABNORMAL HIGH (ref 39–117)
BUN / CREAT RATIO: 9 (ref 9–23)
BUN: 7 mg/dL (ref 6–24)
CALCIUM: 9.3 mg/dL (ref 8.7–10.2)
CHLORIDE: 100 mmol/L (ref 97–108)
CO2: 25 mmol/L (ref 18–29)
CREATININE: 0.77 mg/dL (ref 0.57–1.00)
GFR calc Af Amer: 102 mL/min/{1.73_m2} (ref >59)
GFR calc non Af Amer: 88 mL/min/{1.73_m2} (ref >59)
GLOBULIN, TOTAL: 2.7 g/dL (ref 1.5–4.5)
GLUCOSE: 100 mg/dL — AB (ref 65–99)
Potassium: 4.3 mmol/L (ref 3.5–5.2)
Sodium: 142 mmol/L (ref 134–144)
TOTAL PROTEIN: 7.7 g/dL (ref 6.0–8.5)
Total Bilirubin: <0.2 mg/dL (ref 0.0–1.2)

## 2013-11-26 LAB — CBC WITH DIFFERENTIAL
BASOS: 0 %
Basophils Absolute: 0 10*3/uL (ref 0.0–0.2)
EOS: 2 %
Eosinophils Absolute: 0.1 10*3/uL (ref 0.0–0.4)
HEMATOCRIT: 40.2 % (ref 34.0–46.6)
HEMOGLOBIN: 13.2 g/dL (ref 11.1–15.9)
IMMATURE GRANULOCYTES: 0 %
Immature Grans (Abs): 0 10*3/uL (ref 0.0–0.1)
LYMPHS ABS: 1.2 10*3/uL (ref 0.7–3.1)
Lymphs: 24 %
MCH: 28.6 pg (ref 26.6–33.0)
MCHC: 32.8 g/dL (ref 31.5–35.7)
MCV: 87 fL (ref 79–97)
MONOCYTES: 6 %
MONOS ABS: 0.3 10*3/uL (ref 0.1–0.9)
Neutrophils Absolute: 3.3 10*3/uL (ref 1.4–7.0)
Neutrophils Relative %: 68 %
PLATELETS: 277 10*3/uL (ref 150–379)
RBC: 4.61 x10E6/uL (ref 3.77–5.28)
RDW: 13.4 % (ref 12.3–15.4)
WBC: 4.9 10*3/uL (ref 3.4–10.8)

## 2013-11-26 LAB — PHENOBARBITAL LEVEL: PHENOBARBITAL LVL: 21 ug/mL (ref 15–40)

## 2014-03-24 LAB — LIPID PANEL
Cholesterol: 194 (ref 0–200)
HDL: 63 (ref 35–70)
LDL CALC: 108
Triglycerides: 114 (ref 40–160)

## 2014-03-24 LAB — BASIC METABOLIC PANEL: Glucose: 109

## 2014-06-20 ENCOUNTER — Other Ambulatory Visit: Payer: Self-pay | Admitting: Neurology

## 2014-11-26 ENCOUNTER — Encounter: Payer: Self-pay | Admitting: Adult Health

## 2014-11-26 ENCOUNTER — Ambulatory Visit (INDEPENDENT_AMBULATORY_CARE_PROVIDER_SITE_OTHER): Payer: Managed Care, Other (non HMO) | Admitting: Adult Health

## 2014-11-26 VITALS — BP 109/72 | HR 62 | Ht 65.0 in | Wt 138.0 lb

## 2014-11-26 DIAGNOSIS — Z5181 Encounter for therapeutic drug level monitoring: Secondary | ICD-10-CM | POA: Diagnosis not present

## 2014-11-26 DIAGNOSIS — G40109 Localization-related (focal) (partial) symptomatic epilepsy and epileptic syndromes with simple partial seizures, not intractable, without status epilepticus: Secondary | ICD-10-CM

## 2014-11-26 MED ORDER — PHENOBARBITAL 64.8 MG PO TABS: 129.6000 mg | ORAL_TABLET | Freq: Every day | ORAL | Status: DC

## 2014-11-26 NOTE — Patient Instructions (Signed)
Continue phenobarbital I will check blood work today If you have any seizure events please let us know.

## 2014-11-26 NOTE — Progress Notes (Signed)
I have read the note, and I agree with the clinical assessment and plan.  WILLIS,CHARLES KEITH   

## 2014-11-26 NOTE — Progress Notes (Signed)
PATIENT: [name] DOB: 10/23/1960  REASON FOR VISIT: follow up- seizure HISTORY FROM: patient  HISTORY OF PRESENT ILLNESS: [name] is a 54 year old female with a history of seizure events. She returns today for follow-up. She continues to take phenobarbital. She is tolerating this medication well. Denies having any seizure since the last visit. She does not operate a motor vehicle. She is able to complete all ADLs independently. Denies any changes with her gait or balance. Denies any new neurological symptoms. She returns today for an evaluation.  HISTORY 11/25/13: [name] is a 54 year old female with a history of seizures. She returns today for follow-up. She is currently taking phenobarbital. She reports that her last seizure was 1-2 years ago. She states that she normally does not have any seizures until she is sick. She does not to operate a motor vehicle not because of the seizures but rather due to anxiety when driving. She states that she remains active. No new neurological complaints. No new medical issues since last seen.   HISTORY 11/23/12 (CM): 54 year old white female returns today for followup. She has a history of epilepsy since the age of 22 months. She has been well-controlled on phenobarbital. She denies any side effects to the medication. Appetite is reportedly good, sleeping well, no falls. She has a history of osteoarthritis of the knees particularly the left. She also had a hysterectomy a couple of years ago, she had a DEXA scan (2010) which returned normal. She intermittently exercises, in the winter she does nothing, in the spring and summer she is much more active . No seizure activity in many years. She was seen in the ER back in the spring for abdominal pain, ultrasound detected a spot on her liver and she had elevated enzymes at that time. She claims repeat ultrasound done and the spot had decreased in size. She has no new neurologic complaints.   REVIEW  OF SYSTEMS: Out of a complete 14 system review of symptoms, the patient complains only of the following symptoms, and all other reviewed systems are negative.  Joint pain, joint swelling  ALLERGIES: No Known Allergies  HOME MEDICATIONS: Outpatient Prescriptions Prior to Visit  Medication Sig Dispense Refill  . PHENobarbital (LUMINAL) 64.8 MG tablet TAKE 2 TABLETS BY MOUTH AT BEDTIME 60 tablet 5  . aspirin 81 MG tablet Take 81 mg by mouth daily.    . fluticasone (FLONASE) 50 MCG/ACT nasal spray as needed.     No facility-administered medications prior to visit.    PAST MEDICAL HISTORY: Past Medical History  Diagnosis Date  . Seizures ([place])     PAST SURGICAL HISTORY: Past Surgical History  Procedure Laterality Date  . Abdominal hysterectomy      FAMILY HISTORY: History reviewed. No pertinent family history.  SOCIAL HISTORY: Social History   Social History  . Marital Status: Married    Spouse Name: [name]   . Number of Children: 1  . Years of Education: hs   Occupational History  . unemployed     Social History Main Topics  . Smoking status: Never Smoker   . Smokeless tobacco: Never Used  . Alcohol Use: No  . Drug Use: No  . Sexual Activity: Not on file   Other Topics Concern  . Not on file   Social History Narrative   Patient lives at home with husband [name].    Patient has 1 child.    Patient has a high school education.  PHYSICAL EXAM  Filed Vitals:   11/26/14 0906  BP: 109/72  Pulse: 62  Height: 5\' 5"  (1.651 m)  Weight: 138 lb (62.596 kg)   Body mass index is 22.96 kg/(m^2).  Generalized: Well developed, in no acute distress   Neurological examination  Mentation: Alert oriented to time, place, history taking. Follows all commands speech and language fluent Cranial nerve II-XII: Pupils were equal round reactive to light. Extraocular movements were full, visual field were full on confrontational test. Facial sensation and  strength were normal. Uvula tongue midline. Head turning and shoulder shrug  were normal and symmetric. Motor: The motor testing reveals 5 over 5 strength of all 4 extremities. Good symmetric motor tone is noted throughout.  Sensory: Sensory testing is intact to soft touch on all 4 extremities. No evidence of extinction is noted.  Coordination: Cerebellar testing reveals good finger-nose-finger and heel-to-shin bilaterally.  Gait and station: Gait is normal. Tandem gait is normal. Romberg is negative. No drift is seen.  Reflexes: Deep tendon reflexes are symmetric and normal bilaterally.   DIAGNOSTIC DATA (LABS, IMAGING, TESTING) - I reviewed patient records, labs, notes, testing and imaging myself where available.     ASSESSMENT AND PLAN 54 y.o. year old female  has a past medical history of Seizures ([place]). here with:  1. Seizures  Overall the patient is doing well. She will continue on phenobarbital. I will check blood work today. If she has any additional seizure events she should let us know. She will follow-up in one year or sooner if needed.     Ward Givens, MSN, NP-C 11/26/2014, 9:23 AM [facility] [address], [place], [place] [address] [phone]

## 2014-11-27 ENCOUNTER — Telehealth: Payer: Self-pay

## 2014-11-27 LAB — COMPREHENSIVE METABOLIC PANEL
ALBUMIN: 4.4 g/dL (ref 3.5–5.5)
ALK PHOS: 130 IU/L — AB (ref 39–117)
ALT: 19 IU/L (ref 0–32)
AST: 20 IU/L (ref 0–40)
Albumin/Globulin Ratio: 1.7 (ref 1.1–2.5)
BILIRUBIN TOTAL: 0.3 mg/dL (ref 0.0–1.2)
BUN / CREAT RATIO: 10 (ref 9–23)
BUN: 8 mg/dL (ref 6–24)
CHLORIDE: 100 mmol/L (ref 97–108)
CO2: 25 mmol/L (ref 18–29)
Calcium: 9.5 mg/dL (ref 8.7–10.2)
Creatinine, Ser: 0.78 mg/dL (ref 0.57–1.00)
GFR calc non Af Amer: 86 mL/min/{1.73_m2} (ref >59)
GFR, EST AFRICAN AMERICAN: 100 mL/min/{1.73_m2} (ref >59)
GLOBULIN, TOTAL: 2.6 g/dL (ref 1.5–4.5)
Glucose: 96 mg/dL (ref 65–99)
Potassium: 4.1 mmol/L (ref 3.5–5.2)
SODIUM: 139 mmol/L (ref 134–144)
TOTAL PROTEIN: 7 g/dL (ref 6.0–8.5)

## 2014-11-27 LAB — PHENOBARBITAL LEVEL: PHENOBARBITAL, SERUM: 26 ug/mL (ref 15–40)

## 2014-11-27 LAB — CBC WITH DIFFERENTIAL/PLATELET
BASOS ABS: 0 10*3/uL (ref 0.0–0.2)
BASOS: 1 %
EOS (ABSOLUTE): 0.1 10*3/uL (ref 0.0–0.4)
Eos: 2 %
HEMOGLOBIN: 12.4 g/dL (ref 11.1–15.9)
Hematocrit: 38.5 % (ref 34.0–46.6)
IMMATURE GRANS (ABS): 0 10*3/uL (ref 0.0–0.1)
Immature Granulocytes: 0 %
LYMPHS ABS: 1.3 10*3/uL (ref 0.7–3.1)
LYMPHS: 33 %
MCH: 28.4 pg (ref 26.6–33.0)
MCHC: 32.2 g/dL (ref 31.5–35.7)
MCV: 88 fL (ref 79–97)
MONOCYTES: 10 %
Monocytes Absolute: 0.4 10*3/uL (ref 0.1–0.9)
NEUTROS ABS: 2.1 10*3/uL (ref 1.4–7.0)
Neutrophils: 54 %
Platelets: 228 10*3/uL (ref 150–379)
RBC: 4.37 x10E6/uL (ref 3.77–5.28)
RDW: 13.2 % (ref 12.3–15.4)
WBC: 3.8 10*3/uL (ref 3.4–10.8)

## 2014-11-27 NOTE — Telephone Encounter (Signed)
Called patient. No answer. No vmail left.

## 2014-11-27 NOTE — Telephone Encounter (Signed)
-----   Message from Ward Givens, NP sent at 11/27/2014  8:51 AM EDT ----- Lab work is normal. Please call the patient.

## 2014-12-01 NOTE — Telephone Encounter (Signed)
Spoke to patient. Gave lab results. Patient verbalized understanding.  

## 2014-12-18 ENCOUNTER — Other Ambulatory Visit: Payer: Self-pay | Admitting: Neurology

## 2014-12-19 NOTE — Telephone Encounter (Signed)
I spoke with Jenny Reichmann at the pharmacy who verified they have the Rx that was sent on 10/05 and have filled it

## 2015-06-12 ENCOUNTER — Other Ambulatory Visit: Payer: Self-pay | Admitting: Adult Health

## 2015-06-15 NOTE — Telephone Encounter (Signed)
Rx printed, signed, faxed to pharmacy @ [phone].

## 2015-11-26 ENCOUNTER — Encounter: Payer: Self-pay | Admitting: Adult Health

## 2015-11-26 ENCOUNTER — Ambulatory Visit (INDEPENDENT_AMBULATORY_CARE_PROVIDER_SITE_OTHER): Payer: Managed Care, Other (non HMO) | Admitting: Adult Health

## 2015-11-26 VITALS — BP 112/71 | HR 69 | Ht 65.0 in | Wt 153.8 lb

## 2015-11-26 DIAGNOSIS — Z5181 Encounter for therapeutic drug level monitoring: Secondary | ICD-10-CM | POA: Diagnosis not present

## 2015-11-26 DIAGNOSIS — R569 Unspecified convulsions: Secondary | ICD-10-CM

## 2015-11-26 MED ORDER — PHENOBARBITAL 64.8 MG PO TABS: 129.6000 mg | ORAL_TABLET | Freq: Every day | ORAL | 5 refills | Status: DC

## 2015-11-26 NOTE — Progress Notes (Signed)
PATIENT: [name] DOB: Aug 15, 1960  REASON FOR VISIT: follow up-seizures HISTORY FROM: patient  HISTORY OF PRESENT ILLNESS: [name] is a 55 year old female with a history of seizures. She returns today for follow-up. She continues on phenobarbital for seizure prevention. She denies any seizure events. She continues to tolerate the medication well. She does not drive. Denies any changes with her gait or balance. She is able to complete all ADLs independently. Denies any changes with her mood or behavior. She denies any new medical history. She does report feeling forgetful at times however this is not consistent and does not interfere with her daily activities. She returns today for an evaluation.  HISTORY 11/26/14: [name] is a 55 year old female with a history of seizure events. She returns today for follow-up. She continues to take phenobarbital. She is tolerating this medication well. Denies having any seizure since the last visit. She does not operate a motor vehicle. She is able to complete all ADLs independently. Denies any changes with her gait or balance. Denies any new neurological symptoms. She returns today for an evaluation.  HISTORY 11/25/13: [name] is a 55 year old female with a history of seizures. She returns today for follow-up. She is currently taking phenobarbital. She reports that her last seizure was 1-2 years ago. She states that she normally does not have any seizures until she is sick. She does not to operate a motor vehicle not because of the seizures but rather due to anxiety when driving. She states that she remains active. No new neurological complaints. No new medical issues since last seen.   HISTORY 11/23/12 (CM): 55 year old white female returns today for followup. 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 She has been well-controlled on phenobarbital. She denies any side effects to the medication. Appetite is reportedly good,  sleeping well, no falls. She has a history of osteoarthritis of the knees particularly the left. She also had a hysterectomy a couple of years ago, she had a DEXA scan (2010) which returned normal. She intermittently exercises, in the winter she does nothing, in the spring and summer she is much more active . No seizure activity in many years. She was seen in the ER back in the spring for abdominal pain, ultrasound detected a spot on her liver and she had elevated enzymes at that time. She claims repeat ultrasound done and the spot had decreased in size. She has no new neurologic complaints.   REVIEW OF SYSTEMS: Out of a complete 14 system review of symptoms, the patient complains only of the following symptoms, and all other reviewed systems are negative.  Runny nose  ALLERGIES: No Known Allergies  HOME MEDICATIONS: Outpatient Medications Prior to Visit  Medication Sig Dispense Refill  . aspirin 81 MG tablet Take 81 mg by mouth daily.    . fluticasone (FLONASE) 50 MCG/ACT nasal spray as needed.    Marland Kitchen PHENobarbital (LUMINAL) 64.8 MG tablet TAKE 2 TABLETS BY MOUTH AT BEDTIME 60 tablet 5   No facility-administered medications prior to visit.     PAST MEDICAL HISTORY: Past Medical History:  Diagnosis Date  . Seizures ([place])     PAST SURGICAL HISTORY: Past Surgical History:  Procedure Laterality Date  . ABDOMINAL HYSTERECTOMY      FAMILY HISTORY: No family history on file.  SOCIAL HISTORY: Social History   Social History  . Marital status: Married    Spouse name: [name]   . Number of  children: 1  . Years of education: hs   Occupational History  . unemployed     Social History Main Topics  . Smoking status: Never Smoker  . Smokeless tobacco: Never Used  . Alcohol use No  . Drug use: No  . Sexual activity: Not on file   Other Topics Concern  . Not on file   Social History Narrative   Patient lives at home with husband [name].    Patient has 1 child.    Patient has a  high school education.             PHYSICAL EXAM  Vitals:   11/26/15 0953  BP: 112/71  Pulse: 69  Weight: 153 lb 12.8 oz (69.8 kg)  Height: 5\' 5"  (1.651 m)   Body mass index is 25.59 kg/m.  Generalized: Well developed, in no acute distress   Neurological examination  Mentation: Alert oriented to time, place, history taking. Follows all commands speech and language fluent Cranial nerve II-XII: Pupils were equal round reactive to light. Extraocular movements were full, visual field were full on confrontational test. Facial sensation and strength were normal. Uvula tongue midline. Head turning and shoulder shrug  were normal and symmetric. Motor: The motor testing reveals 5 over 5 strength of all 4 extremities. Good symmetric motor tone is noted throughout.  Sensory: Sensory testing is intact to soft touch on all 4 extremities. No evidence of extinction is noted.  Coordination: Cerebellar testing reveals good finger-nose-finger and heel-to-shin bilaterally.  Gait and station: Gait is normal. Tandem gait is normal. Romberg is negative. No drift is seen.  Reflexes: Deep tendon reflexes are symmetric and normal bilaterally.   DIAGNOSTIC DATA (LABS, IMAGING, TESTING) - I reviewed patient records, labs, notes, testing and imaging myself where available.  Lab Results  Component Value Date   WBC 3.8 11/26/2014   HGB 13.2 11/25/2013   HCT 38.5 11/26/2014   MCV 88 11/26/2014   PLT 228 11/26/2014      Component Value Date/Time   NA 139 11/26/2014 0937   K 4.1 11/26/2014 0937   CL 100 11/26/2014 0937   CO2 25 11/26/2014 0937   GLUCOSE 96 11/26/2014 0937   GLUCOSE 159 (H) 06/04/2012 0035   BUN 8 11/26/2014 0937   CREATININE 0.78 11/26/2014 0937   CALCIUM 9.5 11/26/2014 0937   PROT 7.0 11/26/2014 0937   ALBUMIN 4.4 11/26/2014 0937   AST 20 11/26/2014 0937   ALT 19 11/26/2014 0937   ALKPHOS 130 (H) 11/26/2014 0937   BILITOT 0.3 11/26/2014 0937   GFRNONAA 86 11/26/2014 0937    GFRAA 100 11/26/2014 0937      ASSESSMENT AND PLAN 55 y.o. year old female  has a past medical history of Seizures ([place]). here with:  1. Seizures  Overall the patient is doing well. She will continue on phenobarbital. I will check blood work today. Follow-up in one year with Dr. Jannifer Franklin or sooner if needed.   Ward Givens, MSN, NP-C 11/26/2015, 9:32 AM [facility] [address], [place], [place] [address] [phone]

## 2015-11-26 NOTE — Progress Notes (Signed)
I have read the note, and I agree with the clinical assessment and plan.  WILLIS,CHARLES KEITH   

## 2015-11-26 NOTE — Patient Instructions (Signed)
Continue phenobarbital  Blood work today If you have any seizure events please let us know.   

## 2015-11-27 LAB — COMPREHENSIVE METABOLIC PANEL
A/G RATIO: 1.5 (ref 1.2–2.2)
ALK PHOS: 151 IU/L — AB (ref 39–117)
ALT: 20 IU/L (ref 0–32)
AST: 21 IU/L (ref 0–40)
Albumin: 4.4 g/dL (ref 3.5–5.5)
BUN/Creatinine Ratio: 15 (ref 9–23)
BUN: 11 mg/dL (ref 6–24)
CALCIUM: 9.3 mg/dL (ref 8.7–10.2)
CHLORIDE: 104 mmol/L (ref 96–106)
CO2: 27 mmol/L (ref 18–29)
Creatinine, Ser: 0.74 mg/dL (ref 0.57–1.00)
GFR calc Af Amer: 105 mL/min/{1.73_m2} (ref >59)
GFR calc non Af Amer: 91 mL/min/{1.73_m2} (ref >59)
Globulin, Total: 3 g/dL (ref 1.5–4.5)
Glucose: 109 mg/dL — ABNORMAL HIGH (ref 65–99)
POTASSIUM: 4.5 mmol/L (ref 3.5–5.2)
Sodium: 144 mmol/L (ref 134–144)
Total Protein: 7.4 g/dL (ref 6.0–8.5)

## 2015-11-27 LAB — CBC WITH DIFFERENTIAL/PLATELET
Basophils Absolute: 0 10*3/uL (ref 0.0–0.2)
Basos: 1 %
EOS (ABSOLUTE): 0.1 10*3/uL (ref 0.0–0.4)
Eos: 3 %
Hematocrit: 37.9 % (ref 34.0–46.6)
Hemoglobin: 12.4 g/dL (ref 11.1–15.9)
Immature Grans (Abs): 0 10*3/uL (ref 0.0–0.1)
Immature Granulocytes: 0 %
Lymphocytes Absolute: 1.4 10*3/uL (ref 0.7–3.1)
Lymphs: 25 %
MCH: 28.7 pg (ref 26.6–33.0)
MCHC: 32.7 g/dL (ref 31.5–35.7)
MCV: 88 fL (ref 79–97)
Monocytes Absolute: 0.3 10*3/uL (ref 0.1–0.9)
Monocytes: 6 %
Neutrophils Absolute: 3.7 10*3/uL (ref 1.4–7.0)
Neutrophils: 65 %
Platelets: 266 10*3/uL (ref 150–379)
RBC: 4.32 x10E6/uL (ref 3.77–5.28)
RDW: 13.5 % (ref 12.3–15.4)
WBC: 5.7 10*3/uL (ref 3.4–10.8)

## 2015-11-27 LAB — PHENOBARBITAL LEVEL: Phenobarbital, Serum: 22 ug/mL (ref 15–40)

## 2015-12-01 ENCOUNTER — Telehealth: Payer: Self-pay | Admitting: *Deleted

## 2015-12-01 NOTE — Telephone Encounter (Signed)
Spoke to pt and relayed that labs ok,  Alk Phos elevated may be due to medication.  Will follow.  Has appt next year with Dr. Jannifer Franklin.  Pt verbalized understanding.

## 2015-12-01 NOTE — Telephone Encounter (Signed)
-----   Message from Ward Givens, NP sent at 11/30/2015  8:03 AM EDT ----- Lab work ok. Alkaline phosphatase increased may be due to medication. We will continue to monitor. Please call patient.

## 2016-06-09 ENCOUNTER — Other Ambulatory Visit: Payer: Self-pay | Admitting: Adult Health

## 2016-06-09 NOTE — Telephone Encounter (Signed)
Faxed printed/signed rx by CW,MD to pt pharmacy. Fax: [phone]. Received fax confirmation.

## 2016-09-01 ENCOUNTER — Ambulatory Visit (INDEPENDENT_AMBULATORY_CARE_PROVIDER_SITE_OTHER): Payer: 59 | Admitting: Family Medicine

## 2016-09-01 ENCOUNTER — Encounter: Payer: Self-pay | Admitting: Family Medicine

## 2016-09-01 VITALS — BP 131/80 | HR 97 | Temp 98.4°F | Resp 20 | Ht 65.0 in | Wt 156.5 lb

## 2016-09-01 DIAGNOSIS — Z7689 Persons encountering health services in other specified circumstances: Secondary | ICD-10-CM | POA: Diagnosis not present

## 2016-09-01 DIAGNOSIS — G40109 Localization-related (focal) (partial) symptomatic epilepsy and epileptic syndromes with simple partial seizures, not intractable, without status epilepticus: Secondary | ICD-10-CM | POA: Diagnosis not present

## 2016-09-01 DIAGNOSIS — R21 Rash and other nonspecific skin eruption: Secondary | ICD-10-CM | POA: Diagnosis not present

## 2016-09-01 MED ORDER — TRIAMCINOLONE ACETONIDE 0.1 % EX CREA: 1.0000 "application " | TOPICAL_CREAM | Freq: Two times a day (BID) | CUTANEOUS | 0 refills | Status: DC

## 2016-09-01 NOTE — Progress Notes (Signed)
Patient ID: [name], female  DOB: 26-Dec-1960, 56 y.o.   MRN: [id number] Patient Care Team    Relationship Specialty Notifications Start End  Ma Hillock, DO PCP - General Family Medicine  09/01/16   Ward Givens, NP Registered Nurse Gerontology  09/01/16    Comment: seizures     Chief Complaint  Patient presents with  . Establish Care  . Rash    right leg    Subjective:  [name] is a 56 y.o.  female present for new patient establishment. All past medical history, surgical history, allergies, family history, immunizations, medications and social history were obtained and entered  in the electronic medical record today. All recent labs, ED visits and hospitalizations within the last year were reviewed.  Pt has a h/o seizures since infancy and is prescribed phenobarbital through her Neurologist which she sees yearly.   Rash: pt presents with a rash behind her right knee and right medial thigh. The rash started about 2 weeks ago and is red/purple and itchy. It is starting to improve but is still present. She had a similar rash on her back about 2 years ago that resolved with an "anti-itch" cream. She does endorse both areas are starting to improve with cortisone cream.    Health maintenance: Data collection only, pt is to schedule CPE in 4-6 weeks after all records received.  Colonoscopy: Never. No Fhx. Overdue screen.  Mammogram: No fhx.  completed:2014, birads 1. Overdue screening.   Cervical cancer screening: N/A hysterectomy for menorrhagia.  Immunizations: tdap 03/10/2009, Influenza  (encouraged yearly), PNA not indicated, zostavax will be offered.  Infectious disease screening: HIV and Hep C unknown.    Depression screen [facility] 2/9 09/01/2016  Decreased Interest 2  Down, Depressed, Hopeless 0  PHQ - 2 Score 2  Altered sleeping 1  Tired, decreased energy 3  Change in appetite 0  Feeling bad or failure about yourself  0  Trouble concentrating 0    Moving slowly or fidgety/restless 0  Suicidal thoughts 0  PHQ-9 Score 6  Difficult doing work/chores Somewhat difficult   No flowsheet data found.    Current Exercise Habits: The patient does not participate in regular exercise at present Exercise limited by: None identified Fall Risk  09/01/2016  Falls in the past year? No     Immunization History  Administered Date(s) Administered  . Influenza-Unspecified 11/06/2012  . Tdap 03/10/2009    No exam data present  Past Medical History:  Diagnosis Date  . Allergy   . Arthritis   . Chicken pox   . Seizures ([place])    Follows with Neuro, present since infancy.    No Known Allergies Past Surgical History:  Procedure Laterality Date  . ABDOMINAL HYSTERECTOMY  2006   Menorrhagia   Family History  Problem Relation Age of Onset  . COPD Father 104       Emphysema  . Early death Father    Social History   Social History  . Marital status: Married    Spouse name: [name]   . Number of children: 1  . Years of education: hs   Occupational History  . Homemaker    Social History Main Topics  . Smoking status: Never Smoker  . Smokeless tobacco: Never Used  . Alcohol use No  . Drug use: No  . Sexual activity: No   Other Topics Concern  . Not on file   Social History Narrative   Patient  lives at home with husband [name], and 2 adult children.   Patient has a high school education. She is a Agricultural engineer.   Patient drinks caffeine.   She wears her seatbelt. Smoke detectors in the home.   Firearms locked in the home.   Feels safe in her relationships.         Allergies as of 09/01/2016   No Known Allergies     Medication List       Accurate as of 09/01/16 11:59 PM. Always use your most recent med list.          PHENobarbital 64.8 MG tablet Commonly known as:  LUMINAL TAKE 2 TABLETS BY MOUTH AT BEDTIME   triamcinolone cream 0.1 % Commonly known as:  KENALOG Apply 1 application topically 2 (two) times daily.        All past medical history, surgical history, allergies, family history, immunizations andmedications were updated in the EMR today and reviewed under the history and medication portions of their EMR.    No results found for this or any previous visit (from the past 2160 hour(s)).   ROS: 14 pt review of systems performed and negative (unless mentioned in an HPI)  Objective: BP 131/80 (BP Location: Right Arm, Patient Position: Sitting, Cuff Size: Normal)   Pulse 97   Temp 98.4 F (36.9 C)   Resp 20   Ht 5\' 5"  (1.651 m)   Wt 156 lb 8 oz (71 kg)   SpO2 97%   BMI 26.04 kg/m  Gen: Afebrile. No acute distress. Nontoxic in appearance, well-developed, well-nourished,  pleasant caucasian female.  HENT: AT. [place].  MMM, no oral lesions, adequate dentition.  Eyes:Pupils Equal Round Reactive to light, Extraocular movements intact,  Conjunctiva without redness, discharge or icterus. Skin: Medial right thigh and right posterior knee flat erythemic scaly rash, no purpura or petechiae. Warm and well-perfused. Skin intact. Neuro/Msk: Normal gait. PERLA. EOMi. Alert. Oriented x3.   Psych: Normal affect, dress and demeanor. Normal speech. Normal thought content and judgment.   Assessment/plan: [name] is a 56 y.o. female present for establishment with Rash.  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 - Phenobarbital prescribed through neurology  Rash - Uncertain etiology of rash, similar appearances eczema on exam. Trial of Kenalog cream twice a day for 2 weeks. If not improved would want to see back and consider skin biopsy versus dermatology referral.   Return in about 4 weeks (around 09/29/2016) for CPE.   Note is dictated utilizing voice recognition software. Although note has been proof read prior to signing, occasional typographical errors still can be missed. If any questions arise,  please do not hesitate to call for verification.  Electronically signed by: Howard Pouch, DO [place]

## 2016-09-01 NOTE — Patient Instructions (Signed)
Try Cereve or Cetaphil over the counter after shower daily.  I have called in a steroid cream for you  To use as directed for 2 weeks. If not improved then would need to consider other options.    Please make physical appt and be fasting, for about 4-6 weeks.    It was a pleasure meeting you today.

## 2016-09-02 ENCOUNTER — Encounter: Payer: Self-pay | Admitting: Family Medicine

## 2016-09-02 DIAGNOSIS — R21 Rash and other nonspecific skin eruption: Secondary | ICD-10-CM | POA: Insufficient documentation

## 2016-09-02 DIAGNOSIS — E663 Overweight: Secondary | ICD-10-CM | POA: Insufficient documentation

## 2016-09-06 ENCOUNTER — Encounter: Payer: Self-pay | Admitting: Family Medicine

## 2016-10-05 ENCOUNTER — Encounter: Payer: Self-pay | Admitting: Family Medicine

## 2016-10-05 ENCOUNTER — Telehealth: Payer: Self-pay | Admitting: Family Medicine

## 2016-10-05 ENCOUNTER — Ambulatory Visit (INDEPENDENT_AMBULATORY_CARE_PROVIDER_SITE_OTHER): Payer: 59 | Admitting: Family Medicine

## 2016-10-05 VITALS — BP 117/76 | HR 74 | Temp 98.2°F | Resp 20 | Ht 65.0 in | Wt 155.5 lb

## 2016-10-05 DIAGNOSIS — Z130 Encounter for screening for diseases of the blood and blood-forming organs and certain disorders involving the immune mechanism: Secondary | ICD-10-CM

## 2016-10-05 DIAGNOSIS — Z6826 Body mass index (BMI) 26.0-26.9, adult: Secondary | ICD-10-CM

## 2016-10-05 DIAGNOSIS — Z0000 Encounter for general adult medical examination without abnormal findings: Secondary | ICD-10-CM | POA: Diagnosis not present

## 2016-10-05 DIAGNOSIS — Z1329 Encounter for screening for other suspected endocrine disorder: Secondary | ICD-10-CM

## 2016-10-05 DIAGNOSIS — Z131 Encounter for screening for diabetes mellitus: Secondary | ICD-10-CM | POA: Diagnosis not present

## 2016-10-05 DIAGNOSIS — Z13220 Encounter for screening for lipoid disorders: Secondary | ICD-10-CM

## 2016-10-05 DIAGNOSIS — Z1239 Encounter for other screening for malignant neoplasm of breast: Secondary | ICD-10-CM

## 2016-10-05 DIAGNOSIS — Z1231 Encounter for screening mammogram for malignant neoplasm of breast: Secondary | ICD-10-CM | POA: Diagnosis not present

## 2016-10-05 LAB — LIPID PANEL
CHOL/HDL RATIO: 4
CHOLESTEROL: 198 mg/dL (ref 0–200)
HDL: 55.5 mg/dL (ref >39.00)
LDL Cholesterol: 116 mg/dL — ABNORMAL HIGH (ref 0–99)
NonHDL: 142.62
TRIGLYCERIDES: 133 mg/dL (ref 0.0–149.0)
VLDL: 26.6 mg/dL (ref 0.0–40.0)

## 2016-10-05 LAB — COMPREHENSIVE METABOLIC PANEL
ALBUMIN: 4.6 g/dL (ref 3.5–5.2)
ALK PHOS: 158 U/L — AB (ref 39–117)
ALT: 25 U/L (ref 0–35)
AST: 21 U/L (ref 0–37)
BILIRUBIN TOTAL: 0.4 mg/dL (ref 0.2–1.2)
BUN: 9 mg/dL (ref 6–23)
CALCIUM: 9.4 mg/dL (ref 8.4–10.5)
CO2: 31 mEq/L (ref 19–32)
Chloride: 103 mEq/L (ref 96–112)
Creatinine, Ser: 0.69 mg/dL (ref 0.40–1.20)
GFR: 93.54 mL/min (ref >60.00)
GLUCOSE: 98 mg/dL (ref 70–99)
POTASSIUM: 4.8 meq/L (ref 3.5–5.1)
Sodium: 139 mEq/L (ref 135–145)
TOTAL PROTEIN: 7.1 g/dL (ref 6.0–8.3)

## 2016-10-05 LAB — CBC WITH DIFFERENTIAL/PLATELET
BASOS ABS: 0 10*3/uL (ref 0.0–0.1)
Basophils Relative: 0.4 % (ref 0.0–3.0)
EOS PCT: 1.8 % (ref 0.0–5.0)
Eosinophils Absolute: 0.1 10*3/uL (ref 0.0–0.7)
HEMATOCRIT: 39.8 % (ref 36.0–46.0)
Hemoglobin: 12.9 g/dL (ref 12.0–15.0)
LYMPHS ABS: 1.4 10*3/uL (ref 0.7–4.0)
LYMPHS PCT: 24.9 % (ref 12.0–46.0)
MCHC: 32.4 g/dL (ref 30.0–36.0)
MCV: 87.9 fl (ref 78.0–100.0)
MONOS PCT: 5.9 % (ref 3.0–12.0)
Monocytes Absolute: 0.3 10*3/uL (ref 0.1–1.0)
NEUTROS ABS: 3.7 10*3/uL (ref 1.4–7.7)
NEUTROS PCT: 67 % (ref 43.0–77.0)
Platelets: 268 10*3/uL (ref 150.0–400.0)
RBC: 4.53 Mil/uL (ref 3.87–5.11)
RDW: 13 % (ref 11.5–15.5)
WBC: 5.5 10*3/uL (ref 4.0–10.5)

## 2016-10-05 LAB — TSH: TSH: 2.72 u[IU]/mL (ref 0.35–4.50)

## 2016-10-05 LAB — HEMOGLOBIN A1C: Hgb A1c MFr Bld: 5.5 % (ref 4.6–6.5)

## 2016-10-05 MED ORDER — ZOSTER VAC RECOMB ADJUVANTED 50 MCG/0.5ML IM SUSR: 0.5000 mL | Freq: Once | INTRAMUSCULAR | 1 refills | Status: AC

## 2016-10-05 NOTE — Telephone Encounter (Signed)
Please call patient: - Her labs were all stable, with the exception of one of her liver enzymes being elevated. This has been mildly elevated and followed during her checkups with neurology. It could be secondary to her phenobarbital use, however it has slowly increased since 2014.  - I would like to discuss this in more detail with her and consider further workup for other causes, and possibly imaging of the abdomen. Please have her schedule an appt at her earliest convenience.

## 2016-10-05 NOTE — Patient Instructions (Signed)

## 2016-10-05 NOTE — Progress Notes (Signed)
Patient ID: [name], female  DOB: 03/13/1960, 56 y.o.   MRN: [id number] Patient Care Team    Relationship Specialty Notifications Start End  Ma Hillock, DO PCP - General Family Medicine  09/01/16   Ward Givens, NP Registered Nurse Gerontology  09/01/16    Comment: seizures   Jettie Booze, MD Consulting Physician Cardiology  09/06/16     Chief Complaint  Patient presents with  . Annual Exam    Subjective:  [name] is a 56 y.o.  Female  present for CPE. All past medical history, surgical history, allergies, family history, immunizations, medications and social history were updated/entered in the electronic medical record today. All recent labs, ED visits and hospitalizations within the last year were reviewed.  Health maintenance:  Colonoscopy: Never. No Fhx. --> cologuard ordered today.  Mammogram: [place] Breast ca.  completed:2014, birads 1. --> ordered today Cervical cancer screening: N/A hysterectomy for menorrhagia.  Immunizations: tdap 03/10/2009, Influenza  (encouraged yearly), Shingrix printed.  Infectious disease screening: HIV and Hep C declined DEXA:N/A Assistive device: none Oxygen use: none Patient has a Dental home. Hospitalizations/ED visits: none   Depression screen [facility] 2/9 10/05/2016 09/01/2016  Decreased Interest 0 2  Down, Depressed, Hopeless 0 0  PHQ - 2 Score 0 2  Altered sleeping - 1  Tired, decreased energy - 3  Change in appetite - 0  Feeling bad or failure about yourself  - 0  Trouble concentrating - 0  Moving slowly or fidgety/restless - 0  Suicidal thoughts - 0  PHQ-9 Score - 6  Difficult doing work/chores - Somewhat difficult   No flowsheet data found.   Current Exercise Habits: The patient does not participate in regular exercise at present Exercise limited by: None identified   Immunization History  Administered Date(s) Administered  . Influenza-Unspecified 11/06/2012  . Tdap 03/10/2009     Past  Medical History:  Diagnosis Date  . Allergy   . Arthritis   . Chicken pox   . H/O echocardiogram 12/10/2008   Normal; EF55%  . H/O exercise stress test 12/10/2008   Poor R-wave progression, ST _0 , No ECG changes indicative of ischemia. Nomral HR recovery. No sx of ischemia. Dr. Irish Lack  . 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    No Known Allergies Past Surgical History:  Procedure Laterality Date  . OOPHORECTOMY Right 2006   x1 only with hysterectomy.   . OVARIAN CYST REMOVAL  1994  . PARTIAL HYSTERECTOMY  2006   Menorrhagia/fibroid   Family History  Problem Relation Age of Onset  . COPD Father 40       Emphysema  . Early death Father   . Breast cancer Maternal Aunt 50   Social History   Social History  . Marital status: Married    Spouse name: [name]   . Number of children: 1  . Years of education: hs   Occupational History  . Homemaker    Social History Main Topics  . Smoking status: Never Smoker  . Smokeless tobacco: Never Used  . Alcohol use No  . Drug use: No  . Sexual activity: No   Other Topics Concern  . Not on file   Social History Narrative   Patient lives at home with husband [name], and 2 adult children.   Patient has a high school education. She is a Agricultural engineer.   Patient drinks caffeine.  She wears her seatbelt. Smoke detectors in the home.   Firearms locked in the home.   Feels safe in her relationships.         Allergies as of 10/05/2016   No Known Allergies     Medication List       Accurate as of 10/05/16 11:59 PM. Always use your most recent med list.          PHENobarbital 64.8 MG tablet Commonly known as:  LUMINAL TAKE 2 TABLETS BY MOUTH AT BEDTIME   triamcinolone cream 0.1 % Commonly known as:  KENALOG Apply 1 application topically 2 (two) times daily.   Zoster Vac Recomb Adjuvanted injection Commonly known as:  SHINGRIX Inject 0.5 mLs  into the muscle once. Repeat dose in 2-6 months       All past medical history, surgical history, allergies, family history, immunizations andmedications were updated in the EMR today and reviewed under the history and medication portions of their EMR.     Recent Results (from the past 2160 hour(s))  CBC w/Diff     Status: None   Collection Time: 10/05/16 11:24 AM  Result Value Ref Range   WBC 5.5 4.0 - 10.5 K/uL   RBC 4.53 3.87 - 5.11 Mil/uL   Hemoglobin 12.9 12.0 - 15.0 g/dL   HCT 39.8 36.0 - 46.0 %   MCV 87.9 78.0 - 100.0 fl   MCHC 32.4 30.0 - 36.0 g/dL   RDW 13.0 11.5 - 15.5 %   Platelets 268.0 150.0 - 400.0 K/uL   Neutrophils Relative % 67.0 43.0 - 77.0 %   Lymphocytes Relative 24.9 12.0 - 46.0 %   Monocytes Relative 5.9 3.0 - 12.0 %   Eosinophils Relative 1.8 0.0 - 5.0 %   Basophils Relative 0.4 0.0 - 3.0 %   Neutro Abs 3.7 1.4 - 7.7 K/uL   Lymphs Abs 1.4 0.7 - 4.0 K/uL   Monocytes Absolute 0.3 0.1 - 1.0 K/uL   Eosinophils Absolute 0.1 0.0 - 0.7 K/uL   Basophils Absolute 0.0 0.0 - 0.1 K/uL  Comp Met (CMET)     Status: Abnormal   Collection Time: 10/05/16 11:24 AM  Result Value Ref Range   Sodium 139 135 - 145 mEq/L   Potassium 4.8 3.5 - 5.1 mEq/L   Chloride 103 96 - 112 mEq/L   CO2 31 19 - 32 mEq/L   Glucose, Bld 98 70 - 99 mg/dL   BUN 9 6 - 23 mg/dL   Creatinine, Ser 0.69 0.40 - 1.20 mg/dL   Total Bilirubin 0.4 0.2 - 1.2 mg/dL   Alkaline Phosphatase 158 (H) 39 - 117 U/L   AST 21 0 - 37 U/L   ALT 25 0 - 35 U/L   Total Protein 7.1 6.0 - 8.3 g/dL   Albumin 4.6 3.5 - 5.2 g/dL   Calcium 9.4 8.4 - 10.5 mg/dL   GFR 93.54 >60.00 mL/min  TSH     Status: None   Collection Time: 10/05/16 11:24 AM  Result Value Ref Range   TSH 2.72 0.35 - 4.50 uIU/mL  Lipid panel     Status: Abnormal   Collection Time: 10/05/16 11:24 AM  Result Value Ref Range   Cholesterol 198 0 - 200 mg/dL    Comment: ATP III Classification       Desirable:  < 200 mg/dL               Borderline  High:  200 - 239 mg/dL  High:  > = 240 mg/dL   Triglycerides 133.0 0.0 - 149.0 mg/dL    Comment: Normal:  <150 mg/dLBorderline High:  150 - 199 mg/dL   HDL 55.50 >39.00 mg/dL   VLDL 26.6 0.0 - 40.0 mg/dL   LDL Cholesterol 116 (H) 0 - 99 mg/dL   Total CHOL/HDL Ratio 4     Comment:                Men          Women1/2 Average Risk     3.4          3.3Average Risk          5.0          4.42X Average Risk          9.6          7.13X Average Risk          15.0          11.0                       NonHDL 142.62     Comment: NOTE:  Non-HDL goal should be 30 mg/dL higher than patient's LDL goal (i.e. LDL goal of < 70 mg/dL, would have non-HDL goal of < 100 mg/dL)  HgB A1c     Status: None   Collection Time: 10/05/16 11:24 AM  Result Value Ref Range   Hgb A1c MFr Bld 5.5 4.6 - 6.5 %    Comment: Glycemic Control Guidelines for People with Diabetes:Non Diabetic:  <6%Goal of Therapy: <7%Additional Action Suggested:  >8%     Mr Abdomen W Wo Contrast  Result Date: 06/15/2012 *RADIOLOGY REPORT* Clinical Data: evaluate liver lesion identified on recent CT MRI ABDOMEN WITH AND WITHOUT CONTRAST Technique:  Multiplanar multisequence MR imaging of the abdomen was performed both before and after administration of intravenous contrast. Contrast: 40m MULTIHANCE GADOBENATE DIMEGLUMINE 529 MG/ML IV SOLN Comparison: 06/04/2012 Findings: The lung bases are clear.  There is no pericardial or pleural effusion identified. There is a large stone within the gallbladder measuring 1.6 cm.  No biliary dilatation or secondary signs of acute cholecystitis.  The pancreas is within normal limits.  Normal appearance of the spleen. The adrenal glands both appear normal.  Normal appearance of the right kidney.  The left kidney is normal. No free fluid or fluid collection identified within the upper abdomen. There are no suspicious enhancing liver lesions identified.  There is a T2 hyperintense structure within the periphery of the  inferior right hepatic lobe which exhibits delayed enhancement.  This measures approximately 6 mm.  This is favored to represent a small hemangioma. IMPRESSION: 1.  No suspicious liver lesions identified.  No MRI correlate to the indeterminate liver abnormality identified on recent CT. 2.  T2 hyperintense structure within the right hepatic lobe is favored to represent a benign hemangioma. 3.  Gallstone. Original Report Authenticated By: TKerby Moors M.D.     ROS: 14 pt review of systems performed and negative (unless mentioned in an HPI)  Objective: BP 117/76 (BP Location: Left Arm, Patient Position: Sitting, Cuff Size: Normal)   Pulse 74   Temp 98.2 F (36.8 C)   Resp 20   Ht _0  (1.651 m)   Wt 155 lb 8 oz (70.5 kg)   SpO2 100%   BMI 25.88 kg/m  Gen: Afebrile. No acute distress. Nontoxic in appearance, well-developed, well-nourished,  pleasant Caucasian female. HENT:  AT. [place]. Bilateral TM visualized and normal in appearance, normal external auditory canal. MMM, no oral lesions, adequate dentition. Bilateral nares within normal limits. Throat without erythema, ulcerations or exudates. No Cough on exam, no hoarseness on exam. Eyes:Pupils Equal Round Reactive to light, Extraocular movements intact,  Conjunctiva without redness, discharge or icterus. Neck/lymp/endocrine: Supple, no lymphadenopathy, no thyromegaly CV: RRR no murmur, no edema, +2/4 P posterior tibialis pulses. No carotid bruits. No JVD. Chest: CTAB, no wheeze, rhonchi or crackles. Normal Respiratory effort. Good Air movement. Abd: mildly rigid, Mildly distended. BS present. no Masses palpated. No hepatosplenomegaly. No rebound tenderness or guarding. Skin: no rashes, purpura or petechiae. Warm and well-perfused. Skin intact. Neuro/Msk:  Normal gait. PERLA. EOMi. Alert. Oriented x3.  Cranial nerves II through XII intact. Muscle strength 5/5 upper/lower extremity. DTRs equal bilaterally. Psych: Normal affect, dress and  demeanor. Normal speech. Normal thought content and judgment.   No exam data present  Assessment/plan: ISLAY POLANCO is a 56 y.o. female present for CPE. Screening for diabetes mellitus - HgB A1c Screening for iron deficiency anemia - CBC w/Diff Encounter for preventive health examination BMI 26.0-26.9,adult Patient was encouraged to exercise greater than 150 minutes a week. Patient was encouraged to choose a diet filled with fresh fruits and vegetables, and lean meats. AVS provided to patient today for education/recommendation on gender specific health and safety maintenance Colonoscopy: Never. No Fhx. --> cologuard ordered today.  Mammogram: [place] Breast ca.  completed:2014, birads 1. --> ordered today. Educated on the importance of self breast exams. Cervical cancer screening: N/A hysterectomy for menorrhagia.  Immunizations: tdap 03/10/2009, Influenza  (encouraged yearly), Shingrix printed.  Infectious disease screening: HIV and Hep C declined Lipid screening - Lipid panel, CMP Screening for thyroid disorder - TSH Breast cancer screening - MM SCREENING BREAST TOMO BILATERAL; Future  Return in about 1 year (around 10/05/2017) for CPE.  Electronically signed by: Howard Pouch, DO [place]

## 2016-10-06 NOTE — Telephone Encounter (Signed)
Message left on voice mail for patient to return call. 

## 2016-10-06 NOTE — Telephone Encounter (Signed)
Patient notified and appointment scheduled for 10/19/16.

## 2016-10-19 ENCOUNTER — Encounter: Payer: Self-pay | Admitting: Family Medicine

## 2016-10-19 ENCOUNTER — Ambulatory Visit (INDEPENDENT_AMBULATORY_CARE_PROVIDER_SITE_OTHER): Payer: 59 | Admitting: Family Medicine

## 2016-10-19 VITALS — BP 111/70 | HR 76 | Temp 98.8°F | Resp 20 | Ht 65.0 in | Wt 156.0 lb

## 2016-10-19 DIAGNOSIS — R140 Abdominal distension (gaseous): Secondary | ICD-10-CM | POA: Diagnosis not present

## 2016-10-19 DIAGNOSIS — R748 Abnormal levels of other serum enzymes: Secondary | ICD-10-CM

## 2016-10-19 DIAGNOSIS — N949 Unspecified condition associated with female genital organs and menstrual cycle: Secondary | ICD-10-CM | POA: Diagnosis not present

## 2016-10-19 DIAGNOSIS — R198 Other specified symptoms and signs involving the digestive system and abdomen: Secondary | ICD-10-CM

## 2016-10-19 DIAGNOSIS — Z23 Encounter for immunization: Secondary | ICD-10-CM | POA: Diagnosis not present

## 2016-10-19 DIAGNOSIS — R9389 Abnormal findings on diagnostic imaging of other specified body structures: Secondary | ICD-10-CM

## 2016-10-19 DIAGNOSIS — K769 Liver disease, unspecified: Secondary | ICD-10-CM

## 2016-10-19 DIAGNOSIS — R938 Abnormal findings on diagnostic imaging of other specified body structures: Secondary | ICD-10-CM

## 2016-10-19 LAB — LIPASE: Lipase: 47 U/L (ref 11.0–59.0)

## 2016-10-19 LAB — SEDIMENTATION RATE: Sed Rate: 9 mm/hr (ref 0–30)

## 2016-10-19 LAB — C-REACTIVE PROTEIN: CRP: 1.5 mg/dL (ref 0.5–20.0)

## 2016-10-19 NOTE — Progress Notes (Signed)
[name] , 1960/09/17, 56 y.o., female MRN: [id number] Patient Care Team    Relationship Specialty Notifications Start End  Ma Hillock, DO PCP - General Family Medicine  09/01/16   Ward Givens, NP Registered Nurse Gerontology  09/01/16    Comment: seizures   Jettie Booze, MD Consulting Physician Cardiology  09/06/16     Chief Complaint  Patient presents with  . Results    discuss recent lab results     Subjective:  She returns today to discuss her abnormal laboratory results. Patient has had an elevated alkaline phosphatase dating back to 2014. It has been slowly elevating since that time. Patient is on phenobarbital, which could cause some elevation. Discussed concerns today over levels continued to rise each collection. Also reviewed patient's prior imaging studies dating back to 2014, which showed an abnormality in the left pelvis described as a cystic tubular structure on MR abdomen 2014, with additional imaging by ultrasound July 2015 with similar structure noted, although appeared to be stable. MR abdomen 06/15/2012 also noted for right hepatic lobe hemangioma. Patient has had a hysterectomy and a right oophorectomy secondary to menorrhagia in 2006. Her mammograms are up-to-date, and this year's mammogram is scheduled next week. Patient denies changes in bowel movements, abdominal pain, urinary dysfunction, melena or hematochezia. She does endorse intermittent abdominal bloating.  Depression screen [facility] 2/9 10/05/2016 09/01/2016  Decreased Interest 0 2  Down, Depressed, Hopeless 0 0  PHQ - 2 Score 0 2  Altered sleeping - 1  Tired, decreased energy - 3  Change in appetite - 0  Feeling bad or failure about yourself  - 0  Trouble concentrating - 0  Moving slowly or fidgety/restless - 0  Suicidal thoughts - 0  PHQ-9 Score - 6  Difficult doing work/chores - Somewhat difficult    No Known Allergies Social History  Substance Use Topics  . Smoking status:  Never Smoker  . Smokeless tobacco: Never Used  . Alcohol use No   Past Medical History:  Diagnosis Date  . Allergy   . Arthritis   . Chicken pox   . H/O echocardiogram 12/10/2008   Normal; EF55%  . H/O exercise stress test 12/10/2008   Poor R-wave progression, ST '@baseline'$ , No ECG changes indicative of ischemia. Nomral HR recovery. No sx of ischemia. Dr. Irish Lack  . 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    Past Surgical History:  Procedure Laterality Date  . OOPHORECTOMY Right 2006   x1 only with hysterectomy.   . OVARIAN CYST REMOVAL  1994  . PARTIAL HYSTERECTOMY  2006   Menorrhagia/fibroid   Family History  Problem Relation Age of Onset  . COPD Father 6       Emphysema  . Early death Father   . Breast cancer Maternal Aunt 50   Allergies as of 10/19/2016   No Known Allergies     Medication List       Accurate as of 10/19/16  9:01 AM. Always use your most recent med list.          PHENobarbital 64.8 MG tablet Commonly known as:  LUMINAL TAKE 2 TABLETS BY MOUTH AT BEDTIME   triamcinolone cream 0.1 % Commonly known as:  KENALOG Apply 1 application topically 2 (two) times daily.       All past medical history, surgical history, allergies, family history, immunizations andmedications were updated in the EMR today and  reviewed under the history and medication portions of their EMR.     ROS: Negative, with the exception of above mentioned in HPI   Objective:  BP 111/70 (BP Location: Right Arm, Patient Position: Sitting, Cuff Size: Normal)   Pulse 76   Temp 98.8 F (37.1 C)   Resp 20   Ht '5\' 5"'$  (1.651 m)   Wt 156 lb (70.8 kg)   SpO2 97%   BMI 25.96 kg/m  Body mass index is 25.96 kg/m. Gen: Afebrile. No acute distress. Nontoxic in appearance, well developed, well nourished.  HENT: AT. [place].  MMM, no oral lesions.  Eyes:Pupils Equal Round Reactive to light, Extraocular movements  intact,  Conjunctiva without redness, discharge or icterus. Neck/lymp/endocrine: Supple, no lymphadenopathy CV: RRR, no edema Chest: CTAB, no wheeze or crackles.  Abd: Right abdomen is soft, epigastric and left side of abdomen feels hard. Non-tender.  BS present and normal. No specific mass palpated. No rebound or guarding. Skin: no rashes, purpura or petechiae.  Neuro: Normal gait. PERLA. EOMi. Alert. Oriented x3  Psych: Normal affect, dress and demeanor. Normal speech. Normal thought content and judgment.  No exam data present No results found. No results found for this or any previous visit (from the past 24 hour(s)).  Assessment/Plan: [name] is a 56 y.o. female present for OV for  Elevated alkaline phosphatase level Abnormal abdominal exam/Abnormal ultrasound Abdominal bloating - Discussed concerns with patient today. Given that she has had a steadily elevating alkaline phosphatase, abnormal ultrasound/imaging in the past, and abnormal abdominal exam on her physical and confirmed today, recommend further investigation on potential ovarian/fallopian tube etiology. Discussed with her this could be elevated alkaline phosphatase secondary to her phenobarbital use, but would certainly want to rule out other potential causes such as liver etiology or cancer. Patient is agreeable to this approach. - MR abd/pelvis with and w/o contrast ordered.  - CA 125 - Lipase - C-reactive protein - Sed Rate (ESR)  Influenza vaccine administered - Flu Vaccine QUAD 6+ mos PF IM (Fluarix Quad PF)   Reviewed expectations re: course of current medical issues.  Discussed self-management of symptoms.  Outlined signs and symptoms indicating need for more acute intervention.  Patient verbalized understanding and all questions were answered.  Patient received an After-Visit Summary.    No orders of the defined types were placed in this encounter.    Note is dictated utilizing voice  recognition software. Although note has been proof read prior to signing, occasional typographical errors still can be missed. If any questions arise, please do not hesitate to call for verification.   electronically signed by:  Howard Pouch, DO  [place]

## 2016-10-19 NOTE — Patient Instructions (Signed)
We will call you with lab results once available. They will call you to schedule the abdominal image within the next week.

## 2016-10-20 LAB — CA 125: CA 125: 12 U/mL (ref <35)

## 2016-10-21 ENCOUNTER — Telehealth: Payer: Self-pay | Admitting: Family Medicine

## 2016-10-21 MED ORDER — LORAZEPAM 0.5 MG PO TABS: ORAL_TABLET | ORAL | 0 refills | Status: DC

## 2016-10-21 NOTE — Telephone Encounter (Signed)
Spoke with patient she has never taken any medication in this class . She states she has someone driving her and understands this medication class can cause drowsiness and she cannot drive herself.

## 2016-10-21 NOTE — Telephone Encounter (Signed)
Patient requesting claustrophobia medication for tomorrow's MRI

## 2016-10-21 NOTE — Telephone Encounter (Signed)
Notified patient.

## 2016-10-21 NOTE — Telephone Encounter (Addendum)
Has she ever taken ativan in the past, or similar med for this condition.? She will need someone to drive her if she is not use to taking this medication, it can cause drowsiness or impairment.

## 2016-10-21 NOTE — Telephone Encounter (Signed)
Please call pt- I have called the ativan for her.  Please pull database to be thorough.

## 2016-10-22 ENCOUNTER — Ambulatory Visit (HOSPITAL_BASED_OUTPATIENT_CLINIC_OR_DEPARTMENT_OTHER)
Admission: RE | Admit: 2016-10-22 | Discharge: 2016-10-22 | Disposition: A | Discharge status: Home / Self Care | Payer: 59 | Source: Ambulatory Visit | Attending: Family Medicine | Admitting: Family Medicine

## 2016-10-22 DIAGNOSIS — R748 Abnormal levels of other serum enzymes: Secondary | ICD-10-CM | POA: Diagnosis present

## 2016-10-22 DIAGNOSIS — R140 Abdominal distension (gaseous): Secondary | ICD-10-CM | POA: Insufficient documentation

## 2016-10-22 DIAGNOSIS — R9389 Abnormal findings on diagnostic imaging of other specified body structures: Secondary | ICD-10-CM

## 2016-10-22 DIAGNOSIS — R938 Abnormal findings on diagnostic imaging of other specified body structures: Secondary | ICD-10-CM | POA: Diagnosis not present

## 2016-10-22 DIAGNOSIS — K7689 Other specified diseases of liver: Secondary | ICD-10-CM | POA: Diagnosis not present

## 2016-10-22 DIAGNOSIS — D1803 Hemangioma of intra-abdominal structures: Secondary | ICD-10-CM | POA: Insufficient documentation

## 2016-10-22 DIAGNOSIS — R198 Other specified symptoms and signs involving the digestive system and abdomen: Secondary | ICD-10-CM | POA: Diagnosis not present

## 2016-10-22 DIAGNOSIS — K8020 Calculus of gallbladder without cholecystitis without obstruction: Secondary | ICD-10-CM | POA: Insufficient documentation

## 2016-10-22 MED ORDER — GADOBENATE DIMEGLUMINE 529 MG/ML IV SOLN: 15.0000 mL | Freq: Once | INTRAVENOUS | Status: DC | PRN

## 2016-10-22 MED ORDER — GADOBENATE DIMEGLUMINE 529 MG/ML IV SOLN
15.0000 mL | Freq: Once | INTRAVENOUS | Status: AC | PRN
  Administered 2016-10-22: 13 mL via INTRAVENOUS

## 2016-10-25 ENCOUNTER — Ambulatory Visit (HOSPITAL_BASED_OUTPATIENT_CLINIC_OR_DEPARTMENT_OTHER): Payer: 59

## 2016-10-25 ENCOUNTER — Telehealth: Payer: Self-pay | Admitting: Family Medicine

## 2016-10-25 NOTE — Telephone Encounter (Signed)
Please call pt: Her MRI resulted and does not show any new masses or reason for the elevated alk phos we discussed during her visit.  The structure that was seen a few years ago in the left pelvis is also much smaller and not concerning.  We will continue to monitor alk phos during her routine physicals.

## 2016-10-26 NOTE — Telephone Encounter (Signed)
Patient notified and verbalized understanding. 

## 2016-10-26 NOTE — Telephone Encounter (Signed)
Message left on voice mail for patient to return call. 

## 2016-11-01 ENCOUNTER — Ambulatory Visit (HOSPITAL_BASED_OUTPATIENT_CLINIC_OR_DEPARTMENT_OTHER)
Admission: RE | Admit: 2016-11-01 | Discharge: 2016-11-01 | Disposition: A | Discharge status: Home / Self Care | Payer: 59 | Source: Ambulatory Visit | Attending: Family Medicine | Admitting: Family Medicine

## 2016-11-01 DIAGNOSIS — Z1231 Encounter for screening mammogram for malignant neoplasm of breast: Secondary | ICD-10-CM | POA: Insufficient documentation

## 2016-11-01 DIAGNOSIS — Z1239 Encounter for other screening for malignant neoplasm of breast: Secondary | ICD-10-CM

## 2016-11-30 ENCOUNTER — Encounter: Payer: Self-pay | Admitting: Neurology

## 2016-11-30 ENCOUNTER — Ambulatory Visit (INDEPENDENT_AMBULATORY_CARE_PROVIDER_SITE_OTHER): Payer: 59 | Admitting: Neurology

## 2016-11-30 VITALS — BP 125/70 | HR 65 | Ht 65.0 in | Wt 157.0 lb

## 2016-11-30 DIAGNOSIS — G40109 Localization-related (focal) (partial) symptomatic epilepsy and epileptic syndromes with simple partial seizures, not intractable, without status epilepticus: Secondary | ICD-10-CM

## 2016-11-30 MED ORDER — PHENOBARBITAL 64.8 MG PO TABS: 129.6000 mg | ORAL_TABLET | Freq: Every day | ORAL | 3 refills | Status: DC

## 2016-11-30 NOTE — Progress Notes (Signed)
Reason for visit: Seizures  [name] is an 56 y.o. female  History of present illness:  [name] is a 56 year old right-handed white female with a history of seizures. The patient has been on phenobarbital for quite a number of years, she is well controlled with this. She reports that occasion she may have staring episodes when she is alone, she does not lose consciousness or become unaware of what is going on around her, this does not occur when people are with her or talking to her. She does not operate a motor vehicle, she does not work. She tolerates the phenobarbital well. She has had recent blood work done to include a comprehensive metabolic profile and a CBC. She has a chronic mild elevation of the alkaline phosphatase level.  Past Medical History:  Diagnosis Date  . Allergy   . Arthritis   . Chicken pox   . H/O echocardiogram 12/10/2008   Normal; EF55%  . H/O exercise stress test 12/10/2008   Poor R-wave progression, ST @baseline , No ECG changes indicative of ischemia. Nomral HR recovery. No sx of ischemia. Dr. Irish Lack  . 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     Past Surgical History:  Procedure Laterality Date  . OOPHORECTOMY Right 2006   x1 only with hysterectomy.   . OVARIAN CYST REMOVAL  1994  . PARTIAL HYSTERECTOMY  2006   Menorrhagia/fibroid    Family History  Problem Relation Age of Onset  . COPD Father 78       Emphysema  . Early death Father   . Breast cancer Maternal Aunt 51    Social history:  reports that she has never smoked. She has never used smokeless tobacco. She reports that she does not drink alcohol or use drugs.   No Known Allergies  Medications:  Prior to Admission medications   Medication Sig Start Date End Date Taking? Authorizing Provider  PHENobarbital (LUMINAL) 64.8 MG tablet TAKE 2 TABLETS BY MOUTH AT BEDTIME 06/09/16  Yes Kathrynn Ducking,  MD    ROS:  Out of a complete 14 system review of symptoms, the patient complains only of the following symptoms, and all other reviewed systems are negative.  Joint pain, joint swelling [place]  Blood pressure 125/70, pulse 65, height 5\' 5"  (1.651 m), weight 157 lb (71.2 kg), SpO2 98 %.  Physical Exam  General: The patient is alert and cooperative at the time of the examination.  Skin: No significant peripheral edema is noted.   Neurologic Exam  Mental status: The patient is alert and oriented x 3 at the time of the examination. The patient has apparent normal recent and remote memory, with an apparently normal attention span and concentration ability.   Cranial nerves: Facial symmetry is present. Speech is normal, no aphasia or dysarthria is noted. Extraocular movements are full. Visual fields are full.  Motor: The patient has good strength in all 4 extremities.  Sensory examination: Soft touch sensation is symmetric on the face, arms, and legs.  Coordination: The patient has good finger-nose-finger and heel-to-shin bilaterally.  Gait and station: The patient has a normal gait. Tandem gait is normal. Romberg is negative. No drift is seen.  Reflexes: Deep tendon reflexes are symmetric.   Assessment/Plan:  1. History of seizures, well controlled  The patient will continue the phenobarbital. She has had recent blood work done. We will check back in one year, a prescription  for the phenobarbital was given.  Jill Alexanders MD 11/30/2016 10:21 AM  [facility] [address] [place], [place] [address]  Phone [phone] Fax [phone]

## 2016-11-30 NOTE — Progress Notes (Signed)
Faxed printed/signed rx phenobarbital to [place], [place] at [phone]. Received fax confirmation.

## 2016-12-21 ENCOUNTER — Encounter (HOSPITAL_BASED_OUTPATIENT_CLINIC_OR_DEPARTMENT_OTHER): Payer: Self-pay | Admitting: Emergency Medicine

## 2016-12-21 ENCOUNTER — Emergency Department (HOSPITAL_BASED_OUTPATIENT_CLINIC_OR_DEPARTMENT_OTHER)
Admission: EM | Admit: 2016-12-21 | Discharge: 2016-12-21 | Disposition: A | Discharge status: Home / Self Care | Payer: 59 | Attending: Emergency Medicine | Admitting: Emergency Medicine

## 2016-12-21 ENCOUNTER — Ambulatory Visit (INDEPENDENT_AMBULATORY_CARE_PROVIDER_SITE_OTHER): Payer: 59 | Admitting: Family Medicine

## 2016-12-21 ENCOUNTER — Emergency Department (HOSPITAL_BASED_OUTPATIENT_CLINIC_OR_DEPARTMENT_OTHER): Payer: 59

## 2016-12-21 ENCOUNTER — Encounter: Payer: Self-pay | Admitting: Family Medicine

## 2016-12-21 VITALS — BP 122/77 | HR 76 | Temp 98.4°F | Resp 20 | Wt 158.0 lb

## 2016-12-21 DIAGNOSIS — S4991XA Unspecified injury of right shoulder and upper arm, initial encounter: Secondary | ICD-10-CM | POA: Diagnosis not present

## 2016-12-21 DIAGNOSIS — W19XXXA Unspecified fall, initial encounter: Secondary | ICD-10-CM | POA: Diagnosis not present

## 2016-12-21 DIAGNOSIS — S6991XA Unspecified injury of right wrist, hand and finger(s), initial encounter: Secondary | ICD-10-CM | POA: Diagnosis not present

## 2016-12-21 DIAGNOSIS — Y92007 Garden or yard of unspecified non-institutional (private) residence as the place of occurrence of the external cause: Secondary | ICD-10-CM | POA: Insufficient documentation

## 2016-12-21 DIAGNOSIS — W010XXA Fall on same level from slipping, tripping and stumbling without subsequent striking against object, initial encounter: Secondary | ICD-10-CM | POA: Insufficient documentation

## 2016-12-21 DIAGNOSIS — Y998 Other external cause status: Secondary | ICD-10-CM | POA: Diagnosis not present

## 2016-12-21 DIAGNOSIS — Y9301 Activity, walking, marching and hiking: Secondary | ICD-10-CM | POA: Insufficient documentation

## 2016-12-21 DIAGNOSIS — M25531 Pain in right wrist: Secondary | ICD-10-CM | POA: Diagnosis not present

## 2016-12-21 MED ORDER — TRAMADOL HCL 50 MG PO TABS: 50.0000 mg | ORAL_TABLET | Freq: Three times a day (TID) | ORAL | 0 refills | Status: DC | PRN

## 2016-12-21 MED ORDER — KETOROLAC TROMETHAMINE 60 MG/2ML IM SOLN
30.0000 mg | Freq: Once | INTRAMUSCULAR | Status: AC
  Administered 2016-12-21: 30 mg via INTRAMUSCULAR

## 2016-12-21 NOTE — Patient Instructions (Addendum)
Toradol injection today for pain.  Tramadol script to start once you get home for pain. This is a controlled pain medicine, watch for dizziness or sedation.    Keep arm in sling. Try not to move around.We will call with results and set you up in a ortho clinic (possibly today) if fractured.    Rest, keep elevated, keep light compression, Ice.

## 2016-12-21 NOTE — Progress Notes (Signed)
[name] , 1960/07/28, 56 y.o., female MRN: [id number] Patient Care Team    Relationship Specialty Notifications Start End  Ma Hillock, DO PCP - General Family Medicine  09/01/16   Ward Givens, NP Registered Nurse Gerontology  09/01/16    Comment: seizures   Jettie Booze, MD Consulting Physician Cardiology  09/06/16     Chief Complaint  Patient presents with  . Arm Injury    right arm patient fell wrist and arm swollen     Subjective: Pt presents for an OV with complaints of right wrist and forearm pain after she fell yesterday evening. She states she was walking when she tripped over a piece of wood falling forward into the side. She is uncertain of the position of her hand at the time of fall. She reports a fullness hurting rather significantly last night all the way up into her upper arm. She has been icing the area. Today it is swollen and significantly painful to move. She is right-handed. Pt has tried Advil to ease their symptoms.   Depression screen [facility] 2/9 10/19/2016 10/05/2016 09/01/2016  Decreased Interest 0 0 2  Down, Depressed, Hopeless 0 0 0  PHQ - 2 Score 0 0 2  Altered sleeping 0 - 1  Tired, decreased energy 2 - 3  Change in appetite 0 - 0  Feeling bad or failure about yourself  0 - 0  Trouble concentrating 0 - 0  Moving slowly or fidgety/restless 0 - 0  Suicidal thoughts 0 - 0  PHQ-9 Score 2 - 6  Difficult doing work/chores Not difficult at all - Somewhat difficult    No Known Allergies Social History  Substance Use Topics  . Smoking status: Never Smoker  . Smokeless tobacco: Never Used  . Alcohol use No   Past Medical History:  Diagnosis Date  . Allergy   . Arthritis   . Chicken pox   . H/O echocardiogram 12/10/2008   Normal; EF55%  . H/O exercise stress test 12/10/2008   Poor R-wave progression, ST @baseline , No ECG changes indicative of ischemia. Nomral HR recovery. No sx of ischemia. Dr. Irish Lack  . 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    Past Surgical History:  Procedure Laterality Date  . OOPHORECTOMY Right 2006   x1 only with hysterectomy.   . OVARIAN CYST REMOVAL  1994  . PARTIAL HYSTERECTOMY  2006   Menorrhagia/fibroid   Family History  Problem Relation Age of Onset  . COPD Father 28       Emphysema  . Early death Father   . Breast cancer Maternal Aunt 50   Allergies as of 12/21/2016   No Known Allergies     Medication List       Accurate as of 12/21/16  9:54 AM. Always use your most recent med list.          PHENobarbital 64.8 MG tablet Commonly known as:  LUMINAL Take 2 tablets (129.6 mg total) by mouth at bedtime.       All past medical history, surgical history, allergies, family history, immunizations andmedications were updated in the EMR today and reviewed under the history and medication portions of their EMR.     ROS: Negative, with the exception of above mentioned in HPI   Objective:  BP 122/77 (BP Location: Left Arm, Patient Position: Sitting, Cuff Size: Normal)   Pulse 76   Temp 98.4  F (36.9 C)   Resp 20   Wt 158 lb (71.7 kg)   SpO2 100%   BMI 26.29 kg/m  Body mass index is 26.29 kg/m. Gen: Afebrile. No acute distress. Nontoxic in appearance, well developed, well nourished. Appears uncomfortable. MSK: Right wrist with swelling and mild erythema to mid forearm. Moderately tender mid radius and dorsal wrist diffusely. Full range of motion of right upper arm and shoulder. Normal range of motion of elbow. Normal range of motion of fingers. Very painful and unable to complete range of motion of wrist especially in supination/flexion. Neurovascularly intact distally. Skin:no rashes, purpura or petechiae. Skin intact.   No exam data present No results found. No results found for this or any previous visit (from the past 24 hour(s)).  Assessment/Plan: [name] is a 56 y.o.  female present for OV for  Fall, initial encounter/Right wrist  And arm injury.  - pt fell forward on side and hand out stretched hand. Likley fracture of either wrist and/or distal radial/Ulna. Options provided to pt today included reporting to the emergency room, attempting to get into orthopedic clinic urgently or completing workup outpatient and if needed referral to specialist. She would like to have xray outpatient.  - tramadol script for pain. Rest (sling), keep elevated, compression and ice. - Toradol shot in office.  - Once results are received, will attempt to set up for ortho clinic ASAP if appropriate.  - DG Wrist Complete Right; Future - DG Forearm Right; Future - Follow-up depending upon x-ray results     Reviewed expectations re: course of current medical issues.  Discussed self-management of symptoms.  Outlined signs and symptoms indicating need for more acute intervention.  Patient verbalized understanding and all questions were answered.  Patient received an After-Visit Summary.    No orders of the defined types were placed in this encounter.    Note is dictated utilizing voice recognition software. Although note has been proof read prior to signing, occasional typographical errors still can be missed. If any questions arise, please do not hesitate to call for verification.   electronically signed by:  Howard Pouch, DO  [place]

## 2016-12-21 NOTE — ED Provider Notes (Signed)
[place] EMERGENCY DEPARTMENT Provider Note   CSN: [id number] Arrival date & time: 12/21/16  1036     History   Chief Complaint Chief Complaint  Patient presents with  . Fall    HPI [name] is a 56 y.o. female.  56 yo F with a chief complaint of a mechanical fall.  The patient was walking in the yard and tripped over something that was left out.  Landed on outstretched arm.  Complaining of pain to the right distal radial area.  Worse with movement palpation twisting.  Describes it as a throbbing pain.  She denies other injury.  Denies head injury loss consciousness neck pain back pain chest pain abdominal pain.  Having some continued pain to that area so came to the ED today.  Injury happened yesterday.   The history is provided by the patient.  Fall  This is a new problem. The current episode started yesterday. The problem occurs constantly. The problem has not changed since onset.Pertinent negatives include no chest pain, no headaches and no shortness of breath. The symptoms are aggravated by bending and twisting. Nothing relieves the symptoms.    Past Medical History:  Diagnosis Date  . Allergy   . Arthritis   . Chicken pox   . H/O echocardiogram 12/10/2008   Normal; EF55%  . H/O exercise stress test 12/10/2008   Poor R-wave progression, ST @baseline , No ECG changes indicative of ischemia. Nomral HR recovery. No sx of ischemia. Dr. Irish Lack  . Partial complex seizure disorder without intractable epilepsy ([place])    Follows with Neuro, present since infancy.     Patient Active Problem List   Diagnosis Date Noted  . Rash 09/02/2016  . BMI 25.0-25.9,adult 09/02/2016  . 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 Encounter for therapeutic drug monitoring 11/23/2012    Past Surgical History:  Procedure Laterality Date  . ABDOMINAL HYSTERECTOMY    . OOPHORECTOMY Right 2006   x1 only with hysterectomy.   .  OVARIAN CYST REMOVAL  1994  . PARTIAL HYSTERECTOMY  2006   Menorrhagia/fibroid    OB History    No data available       Home Medications    Prior to Admission medications   Medication Sig Start Date End Date Taking? Authorizing Provider  PHENobarbital (LUMINAL) 64.8 MG tablet Take 2 tablets (129.6 mg total) by mouth at bedtime. 11/30/16   Kathrynn Ducking, MD  traMADol (ULTRAM) 50 MG tablet Take 1 tablet (50 mg total) by mouth every 8 (eight) hours as needed. 12/21/16   Ma Hillock, DO    Family History Family History  Problem Relation Age of Onset  . COPD Father 86       Emphysema  . Early death Father   . Breast cancer Maternal Aunt 50    Social History Social History  Substance Use Topics  . Smoking status: Never Smoker  . Smokeless tobacco: Never Used  . Alcohol use No     Allergies   Patient has no known allergies.   Review of Systems Review of Systems  Constitutional: Negative for chills and fever.  HENT: Negative for congestion and rhinorrhea.   Eyes: Negative for redness and visual disturbance.  Respiratory: Negative for shortness of breath and wheezing.   Cardiovascular: Negative for chest pain and palpitations.  Gastrointestinal: Negative for nausea and vomiting.  Genitourinary: Negative for dysuria and urgency.  Musculoskeletal: Positive for arthralgias. Negative for myalgias.  Skin: Negative for pallor and wound.  Neurological: Negative for dizziness and headaches.     Physical Exam Updated Vital Signs BP (!) 149/76 (BP Location: Right Arm)   Pulse 66   Temp 98.7 F (37.1 C) (Oral)   Resp 18   Ht 5\' 5"  (1.651 m)   Wt 71.7 kg (158 lb)   SpO2 96%   BMI 26.29 kg/m   Physical Exam  Constitutional: She is oriented to person, place, and time. She appears well-developed and well-nourished. No distress.  HENT:  Head: Normocephalic and atraumatic.  Eyes: Pupils are equal, round, and reactive to light. EOM are normal.  Neck: Normal  range of motion. Neck supple.  Cardiovascular: Normal rate and regular rhythm.  Exam reveals no gallop and no friction rub.   No murmur heard. Pulmonary/Chest: Effort normal. She has no wheezes. She has no rales.  Abdominal: Soft. She exhibits no distension and no mass. There is no tenderness. There is no guarding.  Musculoskeletal: She exhibits tenderness. She exhibits no edema.  Mild tenderness diffusely about the right wrist.  She does have some snuffbox tenderness.  Ligaments of the thumb appear intact.  Pulse Motor and sensation is intact distally.  Neurological: She is alert and oriented to person, place, and time.  Skin: Skin is warm and dry. She is not diaphoretic.  Psychiatric: She has a normal mood and affect. Her behavior is normal.  Nursing note and vitals reviewed.    ED Treatments / Results  Labs (all labs ordered are listed, but only abnormal results are displayed) Labs Reviewed - No data to display  EKG  EKG Interpretation None       Radiology Dg Wrist Complete Right  Result Date: 12/21/2016 CLINICAL DATA:  Fall.  Pain EXAM: RIGHT WRIST - COMPLETE 3+ VIEW COMPARISON:  None. FINDINGS: Normal alignment no fracture. Mild degenerative change at the base of the thumb. Mild degenerative change of the ulnar styloid. No erosion. IMPRESSION: Mild degenerative change base of thumb.  No acute abnormality. Electronically Signed   By: Franchot Gallo M.D.   On: 12/21/2016 11:24    Procedures Procedures (including critical care time)  Medications Ordered in ED Medications - No data to display   Initial Impression / Assessment and Plan / ED Course  I have reviewed the triage vital signs and the nursing notes.  Pertinent labs & imaging results that were available during my care of the patient were reviewed by me and considered in my medical decision making (see chart for details).     56  yO F with a chief complaint of right wrist pain after mechanical fall.  X-rays  negative.  She does have some pain at the snuffbox.  Will place in a thumb spica.  Hand follow-up.  1:18 PM:  I have discussed the diagnosis/risks/treatment options with the patient and family and believe the pt to be eligible for discharge home to follow-up with Hand. We also discussed returning to the ED immediately if new or worsening sx occur. We discussed the sx which are most concerning (e.g., sudden worsening pain, fever, inability to tolerate by mouth) that necessitate immediate return. Medications administered to the patient during their visit and any new prescriptions provided to the patient are listed below.  Medications given during this visit Medications - No data to display   The patient appears reasonably screen and/or stabilized for discharge and I doubt any other medical condition or other [facility] requiring further screening, evaluation, or treatment in  the ED at this time prior to discharge.    Final Clinical Impressions(s) / ED Diagnoses   Final diagnoses:  Right wrist pain    New Prescriptions Discharge Medication List as of 12/21/2016 12:11 PM       Deno Etienne, DO 12/21/16 1318

## 2016-12-21 NOTE — ED Triage Notes (Signed)
Pt fell yesterday.  Injured right wrist.  Noted swelling and redness.

## 2016-12-21 NOTE — Discharge Instructions (Signed)
Call the surgeon today and let them know that you had pain at the scaphoid bone.  Take 4 over the counter ibuprofen tablets 3 times a day or 2 over-the-counter naproxen tablets twice a day for pain. Also take tylenol 1000mg (2 extra strength) four times a day.

## 2017-06-01 ENCOUNTER — Other Ambulatory Visit: Payer: Self-pay | Admitting: Neurology

## 2017-08-14 ENCOUNTER — Encounter: Payer: Self-pay | Admitting: Family Medicine

## 2017-08-14 ENCOUNTER — Ambulatory Visit (INDEPENDENT_AMBULATORY_CARE_PROVIDER_SITE_OTHER): Payer: 59 | Admitting: Family Medicine

## 2017-08-14 VITALS — BP 125/78 | HR 71 | Temp 99.0°F | Resp 20 | Ht 65.0 in | Wt 161.0 lb

## 2017-08-14 DIAGNOSIS — I83891 Varicose veins of right lower extremities with other complications: Secondary | ICD-10-CM | POA: Diagnosis not present

## 2017-08-14 DIAGNOSIS — I872 Venous insufficiency (chronic) (peripheral): Secondary | ICD-10-CM

## 2017-08-14 LAB — COMPREHENSIVE METABOLIC PANEL
ALBUMIN: 4.6 g/dL (ref 3.5–5.2)
ALK PHOS: 150 U/L — AB (ref 39–117)
ALT: 32 U/L (ref 0–35)
AST: 24 U/L (ref 0–37)
BUN: 10 mg/dL (ref 6–23)
CALCIUM: 9.8 mg/dL (ref 8.4–10.5)
CHLORIDE: 103 meq/L (ref 96–112)
CO2: 29 mEq/L (ref 19–32)
Creatinine, Ser: 0.73 mg/dL (ref 0.40–1.20)
GFR: 87.38 mL/min (ref >60.00)
Glucose, Bld: 101 mg/dL — ABNORMAL HIGH (ref 70–99)
POTASSIUM: 4.7 meq/L (ref 3.5–5.1)
SODIUM: 139 meq/L (ref 135–145)
TOTAL PROTEIN: 7.7 g/dL (ref 6.0–8.3)
Total Bilirubin: 0.3 mg/dL (ref 0.2–1.2)

## 2017-08-14 NOTE — Progress Notes (Signed)
[name] , 03-30-1960, 57 y.o., female MRN: [id number] Patient Care Team    Relationship Specialty Notifications Start End  Ma Hillock, DO PCP - General Family Medicine  09/01/16   Ward Givens, NP Registered Nurse Gerontology  09/01/16    Comment: seizures   Jettie Booze, MD Consulting Physician Cardiology  09/06/16     Chief Complaint  Patient presents with  . Foot Swelling    right only x 1 month     Subjective: Pt presents for an OV with complaints of right foot swelling of 1 month duration.  Symptoms are worse after standing all day.  If she elevates her leg edema resolves.  She has no pain associated with mild edema.  Had no prior injury to extremity.   Depression screen [facility] 2/9 08/14/2017 10/19/2016 10/05/2016 09/01/2016  Decreased Interest 0 0 0 2  Down, Depressed, Hopeless 0 0 0 0  PHQ - 2 Score 0 0 0 2  Altered sleeping - 0 - 1  Tired, decreased energy - 2 - 3  Change in appetite - 0 - 0  Feeling bad or failure about yourself  - 0 - 0  Trouble concentrating - 0 - 0  Moving slowly or fidgety/restless - 0 - 0  Suicidal thoughts - 0 - 0  PHQ-9 Score - 2 - 6  Difficult doing work/chores - Not difficult at all - Somewhat difficult    No Known Allergies Social History   Tobacco Use  . Smoking status: Never Smoker  . Smokeless tobacco: Never Used  Substance Use Topics  . Alcohol use: No   Past Medical History:  Diagnosis Date  . Allergy   . Arthritis   . Chicken pox   . H/O echocardiogram 12/10/2008   Normal; EF55%  . H/O exercise stress test 12/10/2008   Poor R-wave progression, ST '@baseline'$ , No ECG changes indicative of ischemia. Nomral HR recovery. No sx of ischemia. Dr. Irish Lack  . Partial complex seizure disorder without intractable epilepsy ([place])    Follows with Neuro, present since infancy.    Past Surgical History:  Procedure Laterality Date  . ABDOMINAL HYSTERECTOMY    . OOPHORECTOMY Right 2006   x1 only with hysterectomy.     . OVARIAN CYST REMOVAL  1994  . PARTIAL HYSTERECTOMY  2006   Menorrhagia/fibroid   Family History  Problem Relation Age of Onset  . COPD Father 54       Emphysema  . Early death Father   . Breast cancer Maternal Aunt 50   Allergies as of 08/14/2017   No Known Allergies     Medication List        Accurate as of 08/14/17 11:46 AM. Always use your most recent med list.          PHENobarbital 64.8 MG tablet Commonly known as:  LUMINAL TAKE 2 TABLETS BY MOUTH AT BEDTIME   traMADol 50 MG tablet Commonly known as:  ULTRAM Take 1 tablet (50 mg total) by mouth every 8 (eight) hours as needed.       All past medical history, surgical history, allergies, family history, immunizations andmedications were updated in the EMR today and reviewed under the history and medication portions of their EMR.     ROS: Negative, with the exception of above mentioned in HPI   Objective:  BP 125/78 (BP Location: Left Arm, Patient Position: Sitting, Cuff Size: Normal)   Pulse 71   Temp 99 F (37.2 C)  Resp 20   Ht '5\' 5"'$  (1.651 m)   Wt 161 lb (73 kg)   SpO2 97%   BMI 26.79 kg/m  Body mass index is 26.79 kg/m. Gen: Afebrile. No acute distress. Nontoxic in appearance, well developed, well nourished.  HENT: AT. [place].  MMM Eyes:Pupils Equal Round Reactive to light, Extraocular movements intact,  Conjunctiva without redness, discharge or icterus. CV: RRR no murmur, no edema Chest: CTAB, no wheeze or crackles. Good air movement, normal resp effort.  Skin: No rashes, purpura or petechiae.  Skin intact.  Multiple superficial varicose veins of right lower extremity. Neuro:  Normal gait. PERLA. EOMi. Alert. Oriented x3  Psych: Normal affect, dress and demeanor. Normal speech. Normal thought content and judgment.  No exam data present No results found. No results found for this or any previous visit (from the past 24 hour(s)).  Assessment/Plan: DANGELA HOW is a 57 y.o. 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  No red flags on exam.  Patient has multiple varicosities of her right lower extremity.  Encouraged her to keep feet elevated when able.  Use compression stockings, prescription printed for her today.  Continue to monitor and if worsening could consider vascular referral.  She would like to hold on this for now. - Compression stockings - Comp Met (CMET) -PRN  Reviewed expectations re: course of current medical issues.  Discussed self-management of symptoms.  Outlined signs and symptoms indicating need for more acute intervention.  Patient verbalized understanding and all questions were answered.  Patient received an After-Visit Summary.    No orders of the defined types were placed in this encounter.    Note is dictated utilizing voice recognition software. Although note has been proof read prior to signing, occasional typographical errors still can be missed. If any questions arise, please do not hesitate to call for verification.   electronically signed by:  Howard Pouch, DO  [place]

## 2017-08-14 NOTE — Patient Instructions (Signed)
I have printed for a set of compression stockings.  We will complete your lab today to make sure your liver function is till good.  Low sodium.  Wear compression stockings and keep legs elevated when able.   If worsens or is in both legs can consider mild diuretic and/or vascular referral.    Chronic Venous Insufficiency Chronic venous insufficiency, also called venous stasis, is a condition that prevents blood from being pumped effectively through the veins in your legs. Blood may no longer be pumped effectively from the legs back to the heart. This condition can range from mild to severe. With proper treatment, you should be able to continue with an active life. What are the causes? Chronic venous insufficiency occurs when the vein walls become stretched, weakened, or damaged, or when valves within the vein are damaged. Some common causes of this include:  High blood pressure inside the veins (venous hypertension).  Increased blood pressure in the leg veins from long periods of sitting or standing.  A blood clot that blocks blood flow in a vein (deep vein thrombosis, DVT).  Inflammation of a vein (phlebitis) that causes a blood clot to form.  Tumors in the pelvis that cause blood to back up.  What increases the risk? The following factors may make you more likely to develop this condition:  Having a family history of this condition.  Obesity.  Pregnancy.  Living without enough physical activity or exercise (sedentary lifestyle).  Smoking.  Having a job that requires long periods of standing or sitting in one place.  Being a certain age. Women in their 38s and 31s and men in their 38s are more likely to develop this condition.  What are the signs or symptoms? Symptoms of this condition include:  Veins that are enlarged, bulging, or twisted (varicose veins).  Skin breakdown or ulcers.  Reddened or discolored skin on the front of the leg.  Brown, smooth, tight, and  painful skin just above the ankle, usually on the inside of the leg (lipodermatosclerosis).  Swelling.  How is this diagnosed? This condition may be diagnosed based on:  Your medical history.  A physical exam.  Tests, such as: ? A procedure that creates an image of a blood vessel and nearby organs and provides information about blood flow through the blood vessel (duplex ultrasound). ? A procedure that tests blood flow (plethysmography). ? A procedure to look at the veins using X-ray and dye (venogram).  How is this treated? The goals of treatment are to help you return to an active life and to minimize pain or disability. Treatment depends on the severity of your condition, and it may include:  Wearing compression stockings. These can help relieve symptoms and help prevent your condition from getting worse. However, they do not cure the condition.  Sclerotherapy. This is a procedure involving an injection of a material that "dissolves" damaged veins.  Surgery. This may involve: ? Removing a diseased vein (vein stripping). ? Cutting off blood flow through the vein (laser ablation surgery). ? Repairing a valve.  Follow these instructions at home:  Wear compression stockings as told by your health care provider. These stockings help to prevent blood clots and reduce swelling in your legs.  Take over-the-counter and prescription medicines only as told by your health care provider.  Stay active by exercising, walking, or doing different activities. Ask your health care provider what activities are safe for you and how much exercise you need.  Drink enough fluid  to keep your urine clear or pale yellow.  Do not use any products that contain nicotine or tobacco, such as cigarettes and e-cigarettes. If you need help quitting, ask your health care provider.  Keep all follow-up visits as told by your health care provider. This is important. Contact a health care provider if:  You  have redness, swelling, or more pain in the affected area.  You see a red streak or line that extends up or down from the affected area.  You have skin breakdown or a loss of skin in the affected area, even if the breakdown is small.  You get an injury in the affected area. Get help right away if:  You get an injury and an open wound in the affected area.  You have severe pain that does not get better with medicine.  You have sudden numbness or weakness in the foot or ankle below the affected area, or you have trouble moving your foot or ankle.  You have a fever and you have worse or persistent symptoms.  You have chest pain.  You have shortness of breath. Summary  Chronic venous insufficiency, also called venous stasis, is a condition that prevents blood from being pumped effectively through the veins in your legs.  Chronic venous insufficiency occurs when the vein walls become stretched, weakened, or damaged, or when valves within the vein are damaged.  Treatment for this condition depends on how severe your condition is, and it may involve wearing compression stockings or having a procedure.  Make sure you stay active by exercising, walking, or doing different activities. Ask your health care provider what activities are safe for you and how much exercise you need. This information is not intended to replace advice given to you by your health care provider. Make sure you discuss any questions you have with your health care provider. Document Released: 06/13/2006 Document Revised: 12/28/2015 Document Reviewed: 12/28/2015 Elsevier Interactive Patient Education  2017 [employer].

## 2017-08-16 ENCOUNTER — Encounter: Payer: Self-pay | Admitting: Family Medicine

## 2017-08-16 DIAGNOSIS — I872 Venous insufficiency (chronic) (peripheral): Secondary | ICD-10-CM | POA: Insufficient documentation

## 2017-08-16 DIAGNOSIS — I83891 Varicose veins of right lower extremities with other complications: Secondary | ICD-10-CM | POA: Insufficient documentation

## 2017-10-06 ENCOUNTER — Ambulatory Visit (INDEPENDENT_AMBULATORY_CARE_PROVIDER_SITE_OTHER): Payer: 59 | Admitting: Family Medicine

## 2017-10-06 ENCOUNTER — Encounter: Payer: Self-pay | Admitting: Family Medicine

## 2017-10-06 VITALS — BP 125/76 | HR 90 | Temp 97.0°F | Resp 20 | Ht 65.0 in | Wt 159.5 lb

## 2017-10-06 DIAGNOSIS — Z1239 Encounter for other screening for malignant neoplasm of breast: Secondary | ICD-10-CM

## 2017-10-06 DIAGNOSIS — J45909 Unspecified asthma, uncomplicated: Secondary | ICD-10-CM | POA: Insufficient documentation

## 2017-10-06 DIAGNOSIS — Z1231 Encounter for screening mammogram for malignant neoplasm of breast: Secondary | ICD-10-CM

## 2017-10-06 DIAGNOSIS — E663 Overweight: Secondary | ICD-10-CM

## 2017-10-06 DIAGNOSIS — G40109 Localization-related (focal) (partial) symptomatic epilepsy and epileptic syndromes with simple partial seizures, not intractable, without status epilepticus: Secondary | ICD-10-CM

## 2017-10-06 DIAGNOSIS — Z130 Encounter for screening for diseases of the blood and blood-forming organs and certain disorders involving the immune mechanism: Secondary | ICD-10-CM

## 2017-10-06 DIAGNOSIS — Z23 Encounter for immunization: Secondary | ICD-10-CM | POA: Diagnosis not present

## 2017-10-06 DIAGNOSIS — J302 Other seasonal allergic rhinitis: Secondary | ICD-10-CM

## 2017-10-06 DIAGNOSIS — Z131 Encounter for screening for diabetes mellitus: Secondary | ICD-10-CM | POA: Diagnosis not present

## 2017-10-06 DIAGNOSIS — Z79899 Other long term (current) drug therapy: Secondary | ICD-10-CM

## 2017-10-06 DIAGNOSIS — Z0000 Encounter for general adult medical examination without abnormal findings: Secondary | ICD-10-CM

## 2017-10-06 DIAGNOSIS — R748 Abnormal levels of other serum enzymes: Secondary | ICD-10-CM

## 2017-10-06 DIAGNOSIS — Z1211 Encounter for screening for malignant neoplasm of colon: Secondary | ICD-10-CM

## 2017-10-06 NOTE — Addendum Note (Signed)
Addended by: Gordy Councilman on: 10/06/2017 11:12 AM   Modules accepted: Orders

## 2017-10-06 NOTE — Progress Notes (Signed)
Patient ID: [name], female  DOB: 11/08/1960, 57 y.o.   MRN: [id number] Patient Care Team    Relationship Specialty Notifications Start End  Ma Hillock, DO PCP - General Family Medicine  09/01/16   Ward Givens, NP Registered Nurse Gerontology  09/01/16    Comment: seizures   Jettie Booze, MD Consulting Physician Cardiology  09/06/16     Chief Complaint  Patient presents with  . Annual Exam    Subjective:  [name] is a 57 y.o.  Female  present for CPE . All past medical history, surgical history, allergies, family history, immunizations, medications and social history were updated in the electronic medical record today. All recent labs, ED visits and hospitalizations within the last year were reviewed.  Health maintenance:  Colonoscopy: Never. No Fhx. --> cologuard ordered last year and pt did not complete. Willing to try to do again. Ordered today.   Mammogram: [facility]/mother breast cancer 2019. [facility]. Ordered today Cervical cancer screening: N/A hysterectomy for menorrhagia. CA125 NL 2018 Immunizations: tdap 03/10/2009, Influenza (encouraged yearly), Shingrix completed #1 today Infectious disease screening: HIV declined DEXA:N/A Assistive device: none Oxygen YIF:OYDX Patient has a Dental home. Hospitalizations/ED visits: reviewed   Depression screen [facility] 2/9 10/06/2017 08/14/2017 10/19/2016 10/05/2016 09/01/2016  Decreased Interest 0 0 0 0 2  Down, Depressed, Hopeless 0 0 0 0 0  PHQ - 2 Score 0 0 0 0 2  Altered sleeping - - 0 - 1  Tired, decreased energy - - 2 - 3  Change in appetite - - 0 - 0  Feeling bad or failure about yourself  - - 0 - 0  Trouble concentrating - - 0 - 0  Moving slowly or fidgety/restless - - 0 - 0  Suicidal thoughts - - 0 - 0  PHQ-9 Score - - 2 - 6  Difficult doing work/chores - - Not difficult at all - Somewhat difficult   No flowsheet data found.   Current Exercise  Habits: The patient does not participate in regular exercise at present Exercise limited by: None identified   Immunization History  Administered Date(s) Administered  . Influenza,inj,Quad PF,6+ Mos 10/19/2016  . Influenza-Unspecified 11/06/2012, 12/10/2015  . Tdap 03/10/2009    Past Medical History:  Diagnosis Date  . Allergy   . Arthritis   . Chicken pox   . H/O echocardiogram 12/10/2008   Normal; EF55%  . H/O exercise stress test 12/10/2008   Poor R-wave progression, ST '@baseline'$ , No ECG changes indicative of ischemia. Nomral HR recovery. No sx of ischemia. Dr. Irish Lack  . 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    No Known Allergies Past Surgical History:  Procedure Laterality Date  . ABDOMINAL HYSTERECTOMY    . OOPHORECTOMY Right 2006   x1 only with hysterectomy.   . OVARIAN CYST REMOVAL  1994  . PARTIAL HYSTERECTOMY  2006   Menorrhagia/fibroid   Family History  Problem Relation Age of Onset  . Breast cancer Mother 91  . COPD Father 66       Emphysema  . Early death Father   . Breast cancer Maternal Aunt 64   Social History   Socioeconomic History  . Marital status: Married    Spouse name: [name]   . Number of children: 1  . Years of education: hs  . Highest education level: Not on file  Occupational History  .  Occupation: Agricultural engineer  Social Needs  . Financial resource strain: Not on file  . Food insecurity:    Worry: Not on file    Inability: Not on file  . Transportation needs:    Medical: Not on file    Non-medical: Not on file  Tobacco Use  . Smoking status: Never Smoker  . Smokeless tobacco: Never Used  Substance and Sexual Activity  . Alcohol use: No  . Drug use: No  . Sexual activity: Never    Partners: Male  Lifestyle  . Physical activity:    Days per week: Not on file    Minutes per session: Not on file  . Stress: Not on file  Relationships  . Social  connections:    Talks on phone: Not on file    Gets together: Not on file    Attends religious service: Not on file    Active member of club or organization: Not on file    Attends meetings of clubs or organizations: Not on file    Relationship status: Not on file  . Intimate partner violence:    Fear of current or ex partner: Not on file    Emotionally abused: Not on file    Physically abused: Not on file    Forced sexual activity: Not on file  Other Topics Concern  . Not on file  Social History Narrative   Patient lives at home with husband [name], and 2 adult children.   Patient has a high school education. She is a Agricultural engineer.   Patient drinks caffeine.   She wears her seatbelt. Smoke detectors in the home.   Firearms locked in the home.   Feels safe in her relationships.      Allergies as of 10/06/2017   No Known Allergies     Medication List        Accurate as of 10/06/17 10:46 AM. Always use your most recent med list.          PHENobarbital 64.8 MG tablet Commonly known as:  LUMINAL TAKE 2 TABLETS BY MOUTH AT BEDTIME       All past medical history, surgical history, allergies, family history, immunizations andmedications were updated in the EMR today and reviewed under the history and medication portions of their EMR.     Recent Results (from the past 2160 hour(s))  Comp Met (CMET)     Status: Abnormal   Collection Time: 08/14/17 12:04 PM  Result Value Ref Range   Sodium 139 135 - 145 mEq/L   Potassium 4.7 3.5 - 5.1 mEq/L   Chloride 103 96 - 112 mEq/L   CO2 29 19 - 32 mEq/L   Glucose, Bld 101 (H) 70 - 99 mg/dL   BUN 10 6 - 23 mg/dL   Creatinine, Ser 0.73 0.40 - 1.20 mg/dL   Total Bilirubin 0.3 0.2 - 1.2 mg/dL   Alkaline Phosphatase 150 (H) 39 - 117 U/L   AST 24 0 - 37 U/L   ALT 32 0 - 35 U/L   Total Protein 7.7 6.0 - 8.3 g/dL   Albumin 4.6 3.5 - 5.2 g/dL   Calcium 9.8 8.4 - 10.5 mg/dL   GFR 87.38 >60.00 mL/min    ROS: 14 pt review of systems  performed and negative (unless mentioned in an HPI)  Objective: BP 125/76 (BP Location: Right Arm, Patient Position: Sitting, Cuff Size: Normal)   Pulse 90   Temp (!) 97 F (36.1 C)   Resp 20  Ht '5\' 5"'$  (1.651 m)   Wt 159 lb 8 oz (72.3 kg)   SpO2 97%   BMI 26.54 kg/m  Gen: Afebrile. No acute distress. Nontoxic in appearance, well-developed, well-nourished,  Pleasant, caucasian female. Overweight.  HENT: AT. [place]. Bilateral TM visualized and normal in appearance, normal external auditory canal. MMM, no oral lesions, adequate dentition. Bilateral nares within normal limits. Throat without erythema, ulcerations or exudates. no Cough on exam, no hoarseness on exam. Eyes:Pupils Equal Round Reactive to light, Extraocular movements intact,  Conjunctiva without redness, discharge or icterus. Neck/lymp/endocrine: Supple,no lymphadenopathy, no thyromegaly CV: RRR no murmur, no edema, +2/4 P posterior tibialis pulses. no carotid bruits. No JVD. Chest: CTAB, no wheeze, rhonchi or crackles. normal Respiratory effort. good Air movement. Abd: Soft. Mildly distended. NT. BS present. no Masses palpated. No hepatosplenomegaly. No rebound tenderness or guarding. Skin: no rashes, purpura or petechiae. Warm and well-perfused. Skin intact. Neuro/Msk:  Normal gait. PERLA. EOMi. Alert. Oriented x3.  Cranial nerves II through XII intact. Muscle strength 5/5 upper/lower extremity. DTRs equal bilaterally. Psych: Normal affect, dress and demeanor. Normal speech. Normal thought content and judgment.   No exam data present  Assessment/plan: MAJORIE SANTEE is a 57 y.o. female present for CPE . 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 Followed routinely with Dr. Jannifer Franklin Sarita Haver) - reviewed recent [facility] 07/2017. Chronic elevated alk phos.  Overweight  (BMI 25.0-29.9) Diet and exercise modifications.  - Lipid panel Screening for deficiency anemia - CBC w/Diff Elevated alkaline phosphatase level - poss secondary to phenobarbital use. Has been chronic and stable.  CA125 nl. NL mam 2018. Has not completed Colon cancer screen.  - calcium is in normal range.  - TSH - Vitamin D (25 hydroxy) Breast cancer screening - new dx of breast CA in mother this year. - MM DIGITAL SCREENING BILATERAL; Future Colon cancer screening Strongly encouraged her to complete.  - Cologuard seasonal allergies - start flonase and zyrtec daily. Doe snot appear infectious at this time.  - if sx continue after 2-4 weeks, follow up.  Encounter for preventive health examination Patient was encouraged to exercise greater than 150 minutes a week. Patient was encouraged to choose a diet filled with fresh fruits and vegetables, and lean meats. AVS provided to patient today for education/recommendation on gender specific health and safety maintenance. Colonoscopy:  Ordered today cologuard today, declined colonoscopy.   Mammogram: ordered Cervical cancer screening: N/A hysterectomy  Immunizations: tdap 03/10/2009, Influenza (encouraged yearly), Shingrix completed #1 today Infectious disease screening: HIV declined  Return in about 1 year (around 10/07/2018) for CPE.  Electronically signed by: Howard Pouch, DO [place]

## 2017-10-06 NOTE — Patient Instructions (Signed)
Cologuard will be sent in the mail. Please make sure to complete.  Mammogram is ordered, they will call to schedule. Remember self breast exams around the 13th of every month.  Shingrix #1 today, nurse visit in 2-3, and yu could get your flu shot same day if you want.   Follow up 1 year cpe with me. Unless needed sooner.    Health Maintenance, Female Adopting a healthy lifestyle and getting preventive care can go a long way to promote health and wellness. Talk with your health care provider about what schedule of regular examinations is right for you. This is a good chance for you to check in with your provider about disease prevention and staying healthy. In between checkups, there are plenty of things you can do on your own. Experts have done a lot of research about which lifestyle changes and preventive measures are most likely to keep you healthy. Ask your health care provider for more information. Weight and diet Eat a healthy diet  Be sure to include plenty of vegetables, fruits, low-fat dairy products, and lean protein.  Do not eat a lot of foods high in solid fats, added sugars, or salt.  Get regular exercise. This is one of the most important things you can do for your health. ? Most adults should exercise for at least 150 minutes each week. The exercise should increase your heart rate and make you sweat (moderate-intensity exercise). ? Most adults should also do strengthening exercises at least twice a week. This is in addition to the moderate-intensity exercise.  Maintain a healthy weight  Body mass index (BMI) is a measurement that can be used to identify possible weight problems. It estimates body fat based on height and weight. Your health care provider can help determine your BMI and help you achieve or maintain a healthy weight.  For females 70 years of age and older: ? A BMI below 18.5 is considered underweight. ? A BMI of 18.5 to 24.9 is normal. ? A BMI of 25 to 29.9 is  considered overweight. ? A BMI of 30 and above is considered obese.  Watch levels of cholesterol and blood lipids  You should start having your blood tested for lipids and cholesterol at 57 years of age, then have this test every 5 years.  You may need to have your cholesterol levels checked more often if: ? Your lipid or cholesterol levels are high. ? You are older than 58 years of age. ? You are at high risk for heart disease.  Cancer screening Lung Cancer  Lung cancer screening is recommended for adults 23-57 years old who are at high risk for lung cancer because of a history of smoking.  A yearly low-dose CT scan of the lungs is recommended for people who: ? Currently smoke. ? Have quit within the past 15 years. ? Have at least a 30-pack-year history of smoking. A pack year is smoking an average of one pack of cigarettes a day for 1 year.  Yearly screening should continue until it has been 15 years since you quit.  Yearly screening should stop if you develop a health problem that would prevent you from having lung cancer treatment.  Breast Cancer  Practice breast self-awareness. This means understanding how your breasts normally appear and feel.  It also means doing regular breast self-exams. Let your health care provider know about any changes, no matter how small.  If you are in your 20s or 30s, you should have a  clinical breast exam (CBE) by a health care provider every 1-3 years as part of a regular health exam.  If you are 69 or older, have a CBE every year. Also consider having a breast X-ray (mammogram) every year.  If you have a family history of breast cancer, talk to your health care provider about genetic screening.  If you are at high risk for breast cancer, talk to your health care provider about having an MRI and a mammogram every year.  Breast cancer gene (BRCA) assessment is recommended for women who have family members with BRCA-related cancers.  BRCA-related cancers include: ? Breast. ? Ovarian. ? Tubal. ? Peritoneal cancers.  Results of the assessment will determine the need for genetic counseling and BRCA1 and BRCA2 testing.  Cervical Cancer Your health care provider may recommend that you be screened regularly for cancer of the pelvic organs (ovaries, uterus, and vagina). This screening involves a pelvic examination, including checking for microscopic changes to the surface of your cervix (Pap test). You may be encouraged to have this screening done every 3 years, beginning at age 53.  For women ages 44-65, health care providers may recommend pelvic exams and Pap testing every 3 years, or they may recommend the Pap and pelvic exam, combined with testing for human papilloma virus (HPV), every 5 years. Some types of HPV increase your risk of cervical cancer. Testing for HPV may also be done on women of any age with unclear Pap test results.  Other health care providers may not recommend any screening for nonpregnant women who are considered low risk for pelvic cancer and who do not have symptoms. Ask your health care provider if a screening pelvic exam is right for you.  If you have had past treatment for cervical cancer or a condition that could lead to cancer, you need Pap tests and screening for cancer for at least 20 years after your treatment. If Pap tests have been discontinued, your risk factors (such as having a new sexual partner) need to be reassessed to determine if screening should resume. Some women have medical problems that increase the chance of getting cervical cancer. In these cases, your health care provider may recommend more frequent screening and Pap tests.  Colorectal Cancer  This type of cancer can be detected and often prevented.  Routine colorectal cancer screening usually begins at 57 years of age and continues through 57 years of age.  Your health care provider may recommend screening at an earlier age if  you have risk factors for colon cancer.  Your health care provider may also recommend using home test kits to check for hidden blood in the stool.  A small camera at the end of a tube can be used to examine your colon directly (sigmoidoscopy or colonoscopy). This is done to check for the earliest forms of colorectal cancer.  Routine screening usually begins at age 66.  Direct examination of the colon should be repeated every 5-10 years through 57 years of age. However, you may need to be screened more often if early forms of precancerous polyps or small growths are found.  Skin Cancer  Check your skin from head to toe regularly.  Tell your health care provider about any new moles or changes in moles, especially if there is a change in a mole's shape or color.  Also tell your health care provider if you have a mole that is larger than the size of a pencil eraser.  Always use sunscreen.  Apply sunscreen liberally and repeatedly throughout the day.  Protect yourself by wearing long sleeves, pants, a wide-brimmed hat, and sunglasses whenever you are outside.  Heart disease, diabetes, and high blood pressure  High blood pressure causes heart disease and increases the risk of stroke. High blood pressure is more likely to develop in: ? People who have blood pressure in the high end of the normal range (130-139/85-89 mm Hg). ? People who are overweight or obese. ? People who are African American.  If you are 33-55 years of age, have your blood pressure checked every 3-5 years. If you are 13 years of age or older, have your blood pressure checked every year. You should have your blood pressure measured twice-once when you are at a hospital or clinic, and once when you are not at a hospital or clinic. Record the average of the two measurements. To check your blood pressure when you are not at a hospital or clinic, you can use: ? An automated blood pressure machine at a pharmacy. ? A home blood  pressure monitor.  If you are between 41 years and 67 years old, ask your health care provider if you should take aspirin to prevent strokes.  Have regular diabetes screenings. This involves taking a blood sample to check your fasting blood sugar level. ? If you are at a normal weight and have a low risk for diabetes, have this test once every three years after 57 years of age. ? If you are overweight and have a high risk for diabetes, consider being tested at a younger age or more often. Preventing infection Hepatitis B  If you have a higher risk for hepatitis B, you should be screened for this virus. You are considered at high risk for hepatitis B if: ? You were born in a country where hepatitis B is common. Ask your health care provider which countries are considered high risk. ? Your parents were born in a high-risk country, and you have not been immunized against hepatitis B (hepatitis B vaccine). ? You have HIV or AIDS. ? You use needles to inject street drugs. ? You live with someone who has hepatitis B. ? You have had sex with someone who has hepatitis B. ? You get hemodialysis treatment. ? You take certain medicines for conditions, including cancer, organ transplantation, and autoimmune conditions.  Hepatitis C  Blood testing is recommended for: ? Everyone born from [birth year] through 1965. ? Anyone with known risk factors for hepatitis C.  Sexually transmitted infections (STIs)  You should be screened for sexually transmitted infections (STIs) including gonorrhea and chlamydia if: ? You are sexually active and are younger than 57 years of age. ? You are older than 57 years of age and your health care provider tells you that you are at risk for this type of infection. ? Your sexual activity has changed since you were last screened and you are at an increased risk for chlamydia or gonorrhea. Ask your health care provider if you are at risk.  If you do not have HIV, but are at risk,  it may be recommended that you take a prescription medicine daily to prevent HIV infection. This is called pre-exposure prophylaxis (PrEP). You are considered at risk if: ? You are sexually active and do not regularly use condoms or know the HIV status of your partner(s). ? You take drugs by injection. ? You are sexually active with a partner who has HIV.  Talk with your health care  provider about whether you are at high risk of being infected with HIV. If you choose to begin PrEP, you should first be tested for HIV. You should then be tested every 3 months for as long as you are taking PrEP. Pregnancy  If you are premenopausal and you may become pregnant, ask your health care provider about preconception counseling.  If you may become pregnant, take 400 to 800 micrograms (mcg) of folic acid every day.  If you want to prevent pregnancy, talk to your health care provider about birth control (contraception). Osteoporosis and menopause  Osteoporosis is a disease in which the bones lose minerals and strength with aging. This can result in serious bone fractures. Your risk for osteoporosis can be identified using a bone density scan.  If you are 74 years of age or older, or if you are at risk for osteoporosis and fractures, ask your health care provider if you should be screened.  Ask your health care provider whether you should take a calcium or vitamin D supplement to lower your risk for osteoporosis.  Menopause may have certain physical symptoms and risks.  Hormone replacement therapy may reduce some of these symptoms and risks. Talk to your health care provider about whether hormone replacement therapy is right for you. Follow these instructions at home:  Schedule regular health, dental, and eye exams.  Stay current with your immunizations.  Do not use any tobacco products including cigarettes, chewing tobacco, or electronic cigarettes.  If you are pregnant, do not drink  alcohol.  If you are breastfeeding, limit how much and how often you drink alcohol.  Limit alcohol intake to no more than 1 drink per day for nonpregnant women. One drink equals 12 ounces of beer, 5 ounces of wine, or 1 ounces of hard liquor.  Do not use street drugs.  Do not share needles.  Ask your health care provider for help if you need support or information about quitting drugs.  Tell your health care provider if you often feel depressed.  Tell your health care provider if you have ever been abused or do not feel safe at home. This information is not intended to replace advice given to you by your health care provider. Make sure you discuss any questions you have with your health care provider. Document Released: 08/23/2010 Document Revised: 07/16/2015 Document Reviewed: 11/11/2014 Elsevier Interactive Patient Education  [employer].

## 2017-10-07 LAB — LIPID PANEL
CHOL/HDL RATIO: 4.1 (calc) (ref <5.0)
CHOLESTEROL: 204 mg/dL — AB (ref <200)
HDL: 50 mg/dL — AB (ref >50)
LDL CHOLESTEROL (CALC): 127 mg/dL — AB
Non-HDL Cholesterol (Calc): 154 mg/dL (calc) — ABNORMAL HIGH (ref <130)
TRIGLYCERIDES: 154 mg/dL — AB (ref <150)

## 2017-10-07 LAB — CBC WITH DIFFERENTIAL/PLATELET
Basophils Absolute: 38 cells/uL (ref 0–200)
Basophils Relative: 0.8 %
EOS ABS: 141 {cells}/uL (ref 15–500)
Eosinophils Relative: 3 %
HEMATOCRIT: 38.6 % (ref 35.0–45.0)
Hemoglobin: 12.7 g/dL (ref 11.7–15.5)
Lymphs Abs: 1288 cells/uL (ref 850–3900)
MCH: 28.3 pg (ref 27.0–33.0)
MCHC: 32.9 g/dL (ref 32.0–36.0)
MCV: 86 fL (ref 80.0–100.0)
MPV: 11.8 fL (ref 7.5–12.5)
Monocytes Relative: 8.4 %
Neutro Abs: 2839 cells/uL (ref 1500–7800)
Neutrophils Relative %: 60.4 %
PLATELETS: 251 10*3/uL (ref 140–400)
RBC: 4.49 10*6/uL (ref 3.80–5.10)
RDW: 13 % (ref 11.0–15.0)
TOTAL LYMPHOCYTE: 27.4 %
WBC: 4.7 10*3/uL (ref 3.8–10.8)
WBCMIX: 395 {cells}/uL (ref 200–950)

## 2017-10-07 LAB — TSH: TSH: 2.79 mIU/L (ref 0.40–4.50)

## 2017-10-07 LAB — HEMOGLOBIN A1C
Hgb A1c MFr Bld: 5.6 % of total Hgb (ref <5.7)
Mean Plasma Glucose: 114 (calc)
eAG (mmol/L): 6.3 (calc)

## 2017-10-07 LAB — VITAMIN D 25 HYDROXY (VIT D DEFICIENCY, FRACTURES): VIT D 25 HYDROXY: 16 ng/mL — AB (ref 30–100)

## 2017-10-09 ENCOUNTER — Telehealth: Payer: Self-pay | Admitting: Family Medicine

## 2017-10-09 DIAGNOSIS — E559 Vitamin D deficiency, unspecified: Secondary | ICD-10-CM

## 2017-10-09 MED ORDER — VITAMIN D (ERGOCALCIFEROL) 1.25 MG (50000 UNIT) PO CAPS: 50000.0000 [IU] | ORAL_CAPSULE | ORAL | 0 refills | Status: DC

## 2017-10-09 NOTE — Telephone Encounter (Signed)
Please inform patient the following information: Her vitamin D is severely low at 16. I have called in a supplement to start once a week for 12 weeks.  She should also start a daily vitamin D OTC 1000u with food. F/u 12 weeks with provider appt and recheck of levels.

## 2017-10-09 NOTE — Telephone Encounter (Signed)
Detailed message left on voice mail, okay per DPR.  

## 2017-10-09 NOTE — Telephone Encounter (Signed)
Patient notified of lab results- and appointment scheduled.

## 2017-11-30 ENCOUNTER — Encounter: Payer: Self-pay | Admitting: Adult Health

## 2017-11-30 ENCOUNTER — Ambulatory Visit (INDEPENDENT_AMBULATORY_CARE_PROVIDER_SITE_OTHER): Payer: 59 | Admitting: Adult Health

## 2017-11-30 VITALS — BP 138/74 | HR 76 | Ht 65.0 in | Wt 160.0 lb

## 2017-11-30 DIAGNOSIS — Z5181 Encounter for therapeutic drug level monitoring: Secondary | ICD-10-CM

## 2017-11-30 DIAGNOSIS — G40109 Localization-related (focal) (partial) symptomatic epilepsy and epileptic syndromes with simple partial seizures, not intractable, without status epilepticus: Secondary | ICD-10-CM | POA: Diagnosis not present

## 2017-11-30 MED ORDER — PHENOBARBITAL 64.8 MG PO TABS: 129.6000 mg | ORAL_TABLET | Freq: Every day | ORAL | 0 refills | Status: DC

## 2017-11-30 NOTE — Progress Notes (Signed)
I have read the note, and I agree with the clinical assessment and plan.  Rashauna Tep K Deanna Boehlke   

## 2017-11-30 NOTE — Patient Instructions (Signed)
Your Plan:  Continue Phenobarbital  Blood work today If your symptoms worsen or you develop new symptoms please let us know.   Thank you for coming to see [place] at [facility]. I hope we have been able to provide you high quality care today.  You may receive a patient satisfaction survey over the next few weeks. We would appreciate your feedback and comments so that we may continue to improve ourselves and the health of our patients.

## 2017-11-30 NOTE — Progress Notes (Signed)
PATIENT: [name] DOB: Oct 17, 1960  REASON FOR VISIT: follow up HISTORY FROM: patient  HISTORY OF PRESENT ILLNESS: Today 11/30/17:  [name] is a 57 year old female with a history of seizures.  She returns today for follow-up.  She remains on phenobarbital and tolerates it well.  She denies any seizure events.  She lives with her husband.  She does not operate a motor vehicle.  She is able to complete all ADLs independently.  Denies any changes with her gait or balance.  No change in her mood or behavior.  She returns today for evaluation.  HISTORY 11/30/16: [name] is a 57 year old right-handed white female with a history of seizures. The patient has been on phenobarbital for quite a number of years, she is well controlled with this. She reports that occasion she may have staring episodes when she is alone, she does not lose consciousness or become unaware of what is going on around her, this does not occur when people are with her or talking to her. She does not operate a motor vehicle, she does not work. She tolerates the phenobarbital well. She has had recent blood work done to include a comprehensive metabolic profile and a CBC. She has a chronic mild elevation of the alkaline phosphatase level.   REVIEW OF SYSTEMS: Out of a complete 14 system review of symptoms, the patient complains only of the following symptoms, and all other reviewed systems are negative.  Joint swelling, joint pain, environmental allergies  ALLERGIES: No Known Allergies  HOME MEDICATIONS: Outpatient Medications Prior to Visit  Medication Sig Dispense Refill  . PHENobarbital (LUMINAL) 64.8 MG tablet TAKE 2 TABLETS BY MOUTH AT BEDTIME 180 tablet 1  . Vitamin D, Ergocalciferol, (DRISDOL) 50000 units CAPS capsule Take 1 capsule (50,000 Units total) by mouth every 7 (seven) days. 12 capsule 0   No facility-administered medications prior to visit.     PAST MEDICAL HISTORY: Past Medical  History:  Diagnosis Date  . Allergy   . Arthritis   . Chicken pox   . H/O echocardiogram 12/10/2008   Normal; EF55%  . H/O exercise stress test 12/10/2008   Poor R-wave progression, ST @baseline , No ECG changes indicative of ischemia. Nomral HR recovery. No sx of ischemia. Dr. Irish Lack  . 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     PAST SURGICAL HISTORY: Past Surgical History:  Procedure Laterality Date  . ABDOMINAL HYSTERECTOMY    . OOPHORECTOMY Right 2006   x1 only with hysterectomy.   . OVARIAN CYST REMOVAL  1994  . PARTIAL HYSTERECTOMY  2006   Menorrhagia/fibroid    FAMILY HISTORY: Family History  Problem Relation Age of Onset  . Breast cancer Mother 68  . COPD Father 55       Emphysema  . Early death Father   . Breast cancer Maternal Aunt 50    SOCIAL HISTORY: Social History   Socioeconomic History  . Marital status: Married    Spouse name: [name]   . Number of children: 1  . Years of education: hs  . Highest education level: Not on file  Occupational History  . Occupation: Agricultural engineer  Social Needs  . Financial resource strain: Not on file  . Food insecurity:    Worry: Not on file    Inability: Not on file  . Transportation needs:    Medical: Not on file    Non-medical: Not on file  Tobacco  Use  . Smoking status: Never Smoker  . Smokeless tobacco: Never Used  Substance and Sexual Activity  . Alcohol use: No  . Drug use: No  . Sexual activity: Never    Partners: Male  Lifestyle  . Physical activity:    Days per week: Not on file    Minutes per session: Not on file  . Stress: Not on file  Relationships  . Social connections:    Talks on phone: Not on file    Gets together: Not on file    Attends religious service: Not on file    Active member of club or organization: Not on file    Attends meetings of clubs or organizations: Not on file    Relationship status: Not on  file  . Intimate partner violence:    Fear of current or ex partner: Not on file    Emotionally abused: Not on file    Physically abused: Not on file    Forced sexual activity: Not on file  Other Topics Concern  . Not on file  Social History Narrative   Patient lives at home with husband [name], and 2 adult children.   Patient has a high school education. She is a Agricultural engineer.   Patient drinks caffeine.   She wears her seatbelt. Smoke detectors in the home.   Firearms locked in the home.   Feels safe in her relationships.         PHYSICAL EXAM  Vitals:   11/30/17 1028  BP: 138/74  Pulse: 76  SpO2: 96%  Weight: 160 lb (72.6 kg)  Height: 5\' 5"  (1.651 m)   Body mass index is 26.63 kg/m.  Generalized: Well developed, in no acute distress   Neurological examination  Mentation: Alert oriented to time, place, history taking. Follows all commands speech and language fluent Cranial nerve II-XII: Pupils were equal round reactive to light. Extraocular movements were full, visual field were full on confrontational test. Facial sensation and strength were normal. Uvula tongue midline. Head turning and shoulder shrug  were normal and symmetric. Motor: The motor testing reveals 5 over 5 strength of all 4 extremities. Good symmetric motor tone is noted throughout.  Sensory: Sensory testing is intact to soft touch on all 4 extremities. No evidence of extinction is noted.  Coordination: Cerebellar testing reveals good finger-nose-finger and heel-to-shin bilaterally.  Gait and station: Gait is normal. Tandem gait is normal. Romberg is negative. No drift is seen.  Reflexes: Deep tendon reflexes are symmetric and normal bilaterally.   DIAGNOSTIC DATA (LABS, IMAGING, TESTING) - I reviewed patient records, labs, notes, testing and imaging myself where available.  Lab Results  Component Value Date   WBC 4.7 10/06/2017   HGB 12.7 10/06/2017   HCT 38.6 10/06/2017   MCV 86.0 10/06/2017   PLT  251 10/06/2017      Component Value Date/Time   NA 139 08/14/2017 1204   NA 144 11/26/2015 1009   K 4.7 08/14/2017 1204   CL 103 08/14/2017 1204   CO2 29 08/14/2017 1204   GLUCOSE 101 (H) 08/14/2017 1204   BUN 10 08/14/2017 1204   BUN 11 11/26/2015 1009   CREATININE 0.73 08/14/2017 1204   CALCIUM 9.8 08/14/2017 1204   PROT 7.7 08/14/2017 1204   PROT 7.4 11/26/2015 1009   ALBUMIN 4.6 08/14/2017 1204   ALBUMIN 4.4 11/26/2015 1009   AST 24 08/14/2017 1204   ALT 32 08/14/2017 1204   ALKPHOS 150 (H) 08/14/2017 1204  BILITOT 0.3 08/14/2017 1204   BILITOT <0.2 11/26/2015 1009   GFRNONAA 91 11/26/2015 1009   GFRAA 105 11/26/2015 1009   Lab Results  Component Value Date   CHOL 204 (H) 10/06/2017   HDL 50 (L) 10/06/2017   LDLCALC 127 (H) 10/06/2017   TRIG 154 (H) 10/06/2017   CHOLHDL 4.1 10/06/2017   Lab Results  Component Value Date   HGBA1C 5.6 10/06/2017   No results found for: VITAMINB12 Lab Results  Component Value Date   TSH 2.79 10/06/2017      ASSESSMENT AND PLAN 57 y.o. 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 here with:  1.  Seizures  Overall the patient is doing well.  She will continue on phenobarbital.  I will check blood work today.  She is advised that if her symptoms worsen or she develops new symptoms she should let us know.  She will follow-up in 6 months or sooner if needed.   I spent 15 minutes with the patient. 50% of this time was spent reviewing plan of care   Ward Givens, MSN, NP-C 11/30/2017, 10:46 AM [facility] [address], [place], [place] [address] [phone]

## 2017-12-01 LAB — COMPREHENSIVE METABOLIC PANEL
A/G RATIO: 1.5 (ref 1.2–2.2)
ALBUMIN: 4.3 g/dL (ref 3.5–5.5)
ALT: 21 IU/L (ref 0–32)
AST: 18 IU/L (ref 0–40)
Alkaline Phosphatase: 153 IU/L — ABNORMAL HIGH (ref 39–117)
BUN / CREAT RATIO: 14 (ref 9–23)
BUN: 10 mg/dL (ref 6–24)
Bilirubin Total: 0.3 mg/dL (ref 0.0–1.2)
CALCIUM: 9.5 mg/dL (ref 8.7–10.2)
CHLORIDE: 102 mmol/L (ref 96–106)
CO2: 23 mmol/L (ref 20–29)
Creatinine, Ser: 0.71 mg/dL (ref 0.57–1.00)
GFR, EST AFRICAN AMERICAN: 109 mL/min/{1.73_m2} (ref >59)
GFR, EST NON AFRICAN AMERICAN: 95 mL/min/{1.73_m2} (ref >59)
Globulin, Total: 2.9 g/dL (ref 1.5–4.5)
Glucose: 91 mg/dL (ref 65–99)
POTASSIUM: 4.8 mmol/L (ref 3.5–5.2)
Sodium: 141 mmol/L (ref 134–144)
TOTAL PROTEIN: 7.2 g/dL (ref 6.0–8.5)

## 2017-12-01 LAB — CBC WITH DIFFERENTIAL/PLATELET
Basophils Absolute: 0 10*3/uL (ref 0.0–0.2)
Basos: 1 %
EOS (ABSOLUTE): 0.1 10*3/uL (ref 0.0–0.4)
Eos: 2 %
Hematocrit: 38.1 % (ref 34.0–46.6)
Hemoglobin: 12.6 g/dL (ref 11.1–15.9)
Immature Grans (Abs): 0 10*3/uL (ref 0.0–0.1)
Immature Granulocytes: 0 %
Lymphocytes Absolute: 1.1 10*3/uL (ref 0.7–3.1)
Lymphs: 21 %
MCH: 28.1 pg (ref 26.6–33.0)
MCHC: 33.1 g/dL (ref 31.5–35.7)
MCV: 85 fL (ref 79–97)
Monocytes Absolute: 0.4 10*3/uL (ref 0.1–0.9)
Monocytes: 7 %
Neutrophils Absolute: 3.8 10*3/uL (ref 1.4–7.0)
Neutrophils: 69 %
Platelets: 274 10*3/uL (ref 150–450)
RBC: 4.48 x10E6/uL (ref 3.77–5.28)
RDW: 13.1 % (ref 12.3–15.4)
WBC: 5.4 10*3/uL (ref 3.4–10.8)

## 2017-12-01 LAB — PHENOBARBITAL LEVEL: Phenobarbital, Serum: 21 ug/mL (ref 15–40)

## 2017-12-06 ENCOUNTER — Telehealth: Payer: Self-pay | Admitting: *Deleted

## 2017-12-06 NOTE — Telephone Encounter (Signed)
Spoke with patient and informed her that her blood work is unremarkable. Advised her Alkaline phosphatase is elevated, but this has been consistent. Advised phenobarb level is good. She verbalized understanding, appreciation.

## 2018-01-05 ENCOUNTER — Encounter: Payer: Self-pay | Admitting: Family Medicine

## 2018-01-05 ENCOUNTER — Ambulatory Visit (INDEPENDENT_AMBULATORY_CARE_PROVIDER_SITE_OTHER): Payer: 59 | Admitting: Family Medicine

## 2018-01-05 ENCOUNTER — Ambulatory Visit: Payer: 59

## 2018-01-05 VITALS — BP 130/78 | HR 76 | Temp 97.7°F | Resp 20 | Ht 65.0 in | Wt 157.0 lb

## 2018-01-05 DIAGNOSIS — Z23 Encounter for immunization: Secondary | ICD-10-CM | POA: Diagnosis not present

## 2018-01-05 DIAGNOSIS — E2839 Other primary ovarian failure: Secondary | ICD-10-CM

## 2018-01-05 DIAGNOSIS — R748 Abnormal levels of other serum enzymes: Secondary | ICD-10-CM

## 2018-01-05 DIAGNOSIS — E559 Vitamin D deficiency, unspecified: Secondary | ICD-10-CM | POA: Diagnosis not present

## 2018-01-05 LAB — VITAMIN D 25 HYDROXY (VIT D DEFICIENCY, FRACTURES): VITD: 25.07 ng/mL — ABNORMAL LOW (ref 30.00–100.00)

## 2018-01-05 NOTE — Progress Notes (Signed)
[name] , August 19, 1960, 58 y.o., female MRN: [id number] Patient Care Team    Relationship Specialty Notifications Start End  Ma Hillock, DO PCP - General Family Medicine  09/01/16   Ward Givens, NP Registered Nurse Gerontology  09/01/16    Comment: seizures   Jettie Booze, MD Consulting Physician Cardiology  09/06/16     Chief Complaint  Patient presents with  . Follow-up    Vit D def  . Immunizations    shingrix # 2     Subjective: Pt presents for an OV for follow up on Vit d deficiency. She has not been compliant ergocal 50000u weekly doses, but has taken the majority of them. She reports she forgets bc of the once weekly dosing. She is not  taking a daily OTC vit d. Last vit d (16) 3 months ago. She has had elevated alk phos chronically, w/ normal calcium. Last DEXA 10 years ago- normal (at age 74). She is estrogen deficient   Depression screen [facility] 2/9 10/06/2017 08/14/2017 10/19/2016 10/05/2016 09/01/2016  Decreased Interest 0 0 0 0 2  Down, Depressed, Hopeless 0 0 0 0 0  PHQ - 2 Score 0 0 0 0 2  Altered sleeping - - 0 - 1  Tired, decreased energy - - 2 - 3  Change in appetite - - 0 - 0  Feeling bad or failure about yourself  - - 0 - 0  Trouble concentrating - - 0 - 0  Moving slowly or fidgety/restless - - 0 - 0  Suicidal thoughts - - 0 - 0  PHQ-9 Score - - 2 - 6  Difficult doing work/chores - - Not difficult at all - Somewhat difficult    No Known Allergies Social History   Tobacco Use  . Smoking status: Never Smoker  . Smokeless tobacco: Never Used  Substance Use Topics  . Alcohol use: No   Past Medical History:  Diagnosis Date  . Allergy   . Arthritis   . Chicken pox   . H/O echocardiogram 12/10/2008   Normal; EF55%  . H/O exercise stress test 12/10/2008   Poor R-wave progression, ST _0 , No ECG changes indicative of ischemia. Nomral HR recovery. No sx of ischemia. Dr. Irish Lack  . Partial complex seizure disorder without  intractable epilepsy ([place])    Follows with Neuro, present since infancy.    Past Surgical History:  Procedure Laterality Date  . ABDOMINAL HYSTERECTOMY    . OOPHORECTOMY Right 2006   x1 only with hysterectomy.   . OVARIAN CYST REMOVAL  1994  . PARTIAL HYSTERECTOMY  2006   Menorrhagia/fibroid   Family History  Problem Relation Age of Onset  . Breast cancer Mother 45  . COPD Father 11       Emphysema  . Early death Father   . Breast cancer Maternal Aunt 50   Allergies as of 01/05/2018   No Known Allergies     Medication List        Accurate as of 01/05/18  9:13 AM. Always use your most recent med list.          PHENobarbital 64.8 MG tablet Commonly known as:  LUMINAL Take 2 tablets (129.6 mg total) by mouth at bedtime.   Vitamin D (Ergocalciferol) 1.25 MG (50000 UT) Caps capsule Commonly known as:  DRISDOL Take 1 capsule (50,000 Units total) by mouth every 7 (seven) days.       All past medical history, surgical history, allergies,  family history, immunizations andmedications were updated in the EMR today and reviewed under the history and medication portions of their EMR.     ROS: Negative, with the exception of above mentioned in HPI   Objective:  BP 130/78 (BP Location: Right Arm, Patient Position: Sitting, Cuff Size: Normal)   Pulse 76   Temp 97.7 F (36.5 C)   Resp 20   Ht _0  (1.651 m)   Wt 157 lb (71.2 kg)   SpO2 99%   BMI 26.13 kg/m  Body mass index is 26.13 kg/m. Gen: Afebrile. No acute distress. Nontoxic in appearance, well developed, well nourished.  CV: RRR  Chest: CTAB, no wheeze or crackles. Good air movement, normal resp effort.  Skin: no rashes, purpura or petechiae.  Neuro: Normal gait. PERLA. EOMi. Alert. Oriented x3  Psych: Normal affect, dress and demeanor. Normal speech. Normal thought content and judgment.  No exam data present No results found. No results found for this or any previous visit (from the past 24  hour(s)).  Assessment/Plan: [name] is a 57 y.o. female present for OV for  Immunization due - Flu Vaccine QUAD 6+ mos PF IM (Fluarix Quad PF) - Varicella-zoster vaccine IM  Vitamin D deficiency/Estrogen deficiency/ Elevated alkaline phosphatase level - collect levels of vit d today. Will prescribed daily Vit D for her based on the her results. She is due for her mammogram, ordered dexa today --> would like to see if they could be scheduled daily.  - chronic elevated alk phos stable- felt to be from phenobarbital use.  - Vitamin D (25 hydroxy) - DG Bone Density; Future - f/u dependent on results.    Reviewed expectations re: course of current medical issues.  Discussed self-management of symptoms.  Outlined signs and symptoms indicating need for more acute intervention.  Patient verbalized understanding and all questions were answered.  Patient received an After-Visit Summary.    Orders Placed This Encounter  Procedures  . Flu Vaccine QUAD 6+ mos PF IM (Fluarix Quad PF)  . Varicella-zoster vaccine IM  . Vitamin D (25 hydroxy)     Note is dictated utilizing voice recognition software. Although note has been proof read prior to signing, occasional typographical errors still can be missed. If any questions arise, please do not hesitate to call for verification.   electronically signed by:  Howard Pouch, DO  [place]

## 2018-01-05 NOTE — Patient Instructions (Addendum)
We will collect the repeat levels of Vit D for you today.  I have ordered your bone density also to montior your bone health.  I will call in a supplement for you once we see how your levels responded to the supplement you did get in.     Vitamin D Deficiency Vitamin D deficiency is when your body does not have enough vitamin D. Vitamin D is important because:  It helps your body use other minerals that your body needs.  It helps keep your bones strong and healthy.  It may help to prevent some diseases.  It helps your heart and other muscles work well.  You can get vitamin D by:  Eating foods with vitamin D in them.  Drinking or eating milk or other foods that have had vitamin D added to them.  Taking a vitamin D supplement.  Being in the sun.  Not getting enough vitamin D can make your bones become soft. It can also cause other health problems. Follow these instructions at home:  Take medicines and supplements only as told by your doctor.  Eat foods that have vitamin D. These include: ? Dairy products, cereals, or juices with added vitamin D. Check the label for vitamin D. ? Fatty fish like salmon or trout. ? Eggs. ? Oysters.  Do not use tanning beds.  Stay at a healthy weight. Lose weight, if needed.  Keep all follow-up visits as told by your doctor. This is important. Contact a doctor if:  Your symptoms do not go away.  You feel sick to your stomach (nauseous).  Youthrow up (vomit).  You poop less often than usual or you have trouble pooping (constipation). This information is not intended to replace advice given to you by your health care provider. Make sure you discuss any questions you have with your health care provider. Document Released: 01/27/2011 Document Revised: 07/16/2015 Document Reviewed: 06/25/2014 Elsevier Interactive Patient Education  [employer].

## 2018-01-08 ENCOUNTER — Telehealth: Payer: Self-pay | Admitting: Family Medicine

## 2018-01-08 MED ORDER — CHOLECALCIFEROL 25 MCG (1000 UT) PO TABS: 1000.0000 [IU] | ORAL_TABLET | Freq: Every day | ORAL | 3 refills | Status: DC

## 2018-01-08 NOTE — Telephone Encounter (Signed)
Detailed message left on voice mail. Okay per DPR. 

## 2018-01-08 NOTE — Telephone Encounter (Signed)
Please inform patient the following information: Her vit d did come up from 16-->25. Still deficient, goal 30-50. I  Have called in a DAILY Vit D 1000u- to be taken with food for best adsorption. If insurance does not cover, she can purchase OTC. No further follow up needed since she had a decent response to the Vit D she did take, her taking the daily dose will bring her level up to normal and maintain it. Will check levels yearly at physicals which is Due end of August (if not scheduled already).

## 2018-01-10 ENCOUNTER — Ambulatory Visit (HOSPITAL_BASED_OUTPATIENT_CLINIC_OR_DEPARTMENT_OTHER)
Admission: RE | Admit: 2018-01-10 | Discharge: 2018-01-10 | Disposition: A | Discharge status: Home / Self Care | Payer: 59 | Source: Ambulatory Visit | Attending: Family Medicine | Admitting: Family Medicine

## 2018-01-10 DIAGNOSIS — E559 Vitamin D deficiency, unspecified: Secondary | ICD-10-CM

## 2018-01-10 DIAGNOSIS — Z1239 Encounter for other screening for malignant neoplasm of breast: Secondary | ICD-10-CM | POA: Insufficient documentation

## 2018-01-10 DIAGNOSIS — E2839 Other primary ovarian failure: Secondary | ICD-10-CM

## 2018-01-10 DIAGNOSIS — R748 Abnormal levels of other serum enzymes: Secondary | ICD-10-CM | POA: Insufficient documentation

## 2018-02-19 ENCOUNTER — Other Ambulatory Visit: Payer: Self-pay | Admitting: Adult Health

## 2018-03-01 ENCOUNTER — Other Ambulatory Visit: Payer: Self-pay | Admitting: Adult Health

## 2018-03-02 ENCOUNTER — Telehealth: Payer: Self-pay | Admitting: Adult Health

## 2018-03-02 NOTE — Telephone Encounter (Signed)
Pt would like a refill on her PHENobarbital (LUMINAL) 64.8 MG tablet sent to [facility]. Pt states she has no more left after today.

## 2018-03-02 NOTE — Telephone Encounter (Signed)
[place] Database Verified LR 01-30-18 Qty: 60 Pending appointment 12-05-2018.

## 2018-03-02 NOTE — Telephone Encounter (Addendum)
The request has been sent to Dr. Jannifer Franklin.

## 2018-05-18 ENCOUNTER — Telehealth: Payer: Self-pay | Admitting: Family Medicine

## 2018-05-18 NOTE — Telephone Encounter (Signed)
Patient called this morning requesting to be seen today. She states that she has a cough, congestion, no fever, no travel.    Please advise if patient needs to come in or a virtual visit. She feels as this may be allergies or sinus infection.    Thank you Hinton Dyer

## 2018-05-18 NOTE — Telephone Encounter (Signed)
Please schedule patient as virtual or phone visit

## 2018-05-22 ENCOUNTER — Ambulatory Visit: Payer: 59 | Admitting: Family Medicine

## 2018-08-23 ENCOUNTER — Other Ambulatory Visit: Payer: Self-pay | Admitting: Adult Health

## 2018-08-23 NOTE — Telephone Encounter (Signed)
Drug registry checked Phenobarbital 64.8mg  last refill 05-31-18 #[id number].  Annual RV scheduled in 10/20.

## 2018-08-23 NOTE — Telephone Encounter (Signed)
Pt is requesting a refill of PHENobarbital (LUMINAL) 64.8 MG tablet , to be [facility] to [id number] [place], [place] - [address] AT [place]

## 2018-08-27 MED ORDER — PHENOBARBITAL 64.8 MG PO TABS: 129.6000 mg | ORAL_TABLET | Freq: Every day | ORAL | 1 refills | Status: DC

## 2018-10-10 ENCOUNTER — Encounter: Payer: 59 | Admitting: Family Medicine

## 2018-10-24 ENCOUNTER — Other Ambulatory Visit: Payer: Self-pay

## 2018-10-24 ENCOUNTER — Encounter: Payer: Self-pay | Admitting: Family Medicine

## 2018-10-24 ENCOUNTER — Ambulatory Visit (INDEPENDENT_AMBULATORY_CARE_PROVIDER_SITE_OTHER): Payer: 59 | Admitting: Family Medicine

## 2018-10-24 ENCOUNTER — Telehealth: Payer: Self-pay | Admitting: Family Medicine

## 2018-10-24 VITALS — BP 110/71 | HR 80 | Temp 97.2°F | Resp 17 | Ht 64.37 in | Wt 160.2 lb

## 2018-10-24 DIAGNOSIS — G40109 Localization-related (focal) (partial) symptomatic epilepsy and epileptic syndromes with simple partial seizures, not intractable, without status epilepticus: Secondary | ICD-10-CM

## 2018-10-24 DIAGNOSIS — Z131 Encounter for screening for diabetes mellitus: Secondary | ICD-10-CM

## 2018-10-24 DIAGNOSIS — E663 Overweight: Secondary | ICD-10-CM | POA: Diagnosis not present

## 2018-10-24 DIAGNOSIS — R748 Abnormal levels of other serum enzymes: Secondary | ICD-10-CM

## 2018-10-24 DIAGNOSIS — Z0000 Encounter for general adult medical examination without abnormal findings: Secondary | ICD-10-CM | POA: Diagnosis not present

## 2018-10-24 DIAGNOSIS — Z1211 Encounter for screening for malignant neoplasm of colon: Secondary | ICD-10-CM | POA: Diagnosis not present

## 2018-10-24 DIAGNOSIS — Z23 Encounter for immunization: Secondary | ICD-10-CM | POA: Diagnosis not present

## 2018-10-24 DIAGNOSIS — E559 Vitamin D deficiency, unspecified: Secondary | ICD-10-CM | POA: Diagnosis not present

## 2018-10-24 DIAGNOSIS — Z1239 Encounter for other screening for malignant neoplasm of breast: Secondary | ICD-10-CM

## 2018-10-24 DIAGNOSIS — Z5181 Encounter for therapeutic drug level monitoring: Secondary | ICD-10-CM | POA: Diagnosis not present

## 2018-10-24 LAB — CBC WITH DIFFERENTIAL/PLATELET
Basophils Absolute: 0 10*3/uL (ref 0.0–0.1)
Basophils Relative: 0.7 % (ref 0.0–3.0)
Eosinophils Absolute: 0.1 10*3/uL (ref 0.0–0.7)
Eosinophils Relative: 2.5 % (ref 0.0–5.0)
HCT: 39.8 % (ref 36.0–46.0)
Hemoglobin: 13 g/dL (ref 12.0–15.0)
Lymphocytes Relative: 25.3 % (ref 12.0–46.0)
Lymphs Abs: 1.3 10*3/uL (ref 0.7–4.0)
MCHC: 32.6 g/dL (ref 30.0–36.0)
MCV: 88.3 fl (ref 78.0–100.0)
Monocytes Absolute: 0.4 10*3/uL (ref 0.1–1.0)
Monocytes Relative: 8.3 % (ref 3.0–12.0)
Neutro Abs: 3.3 10*3/uL (ref 1.4–7.7)
Neutrophils Relative %: 63.2 % (ref 43.0–77.0)
Platelets: 236 10*3/uL (ref 150.0–400.0)
RBC: 4.51 Mil/uL (ref 3.87–5.11)
RDW: 13.3 % (ref 11.5–15.5)
WBC: 5.3 10*3/uL (ref 4.0–10.5)

## 2018-10-24 LAB — COMPREHENSIVE METABOLIC PANEL
ALT: 34 U/L (ref 0–35)
AST: 23 U/L (ref 0–37)
Albumin: 4.4 g/dL (ref 3.5–5.2)
Alkaline Phosphatase: 159 U/L — ABNORMAL HIGH (ref 39–117)
BUN: 9 mg/dL (ref 6–23)
CO2: 30 mEq/L (ref 19–32)
Calcium: 9.6 mg/dL (ref 8.4–10.5)
Chloride: 104 mEq/L (ref 96–112)
Creatinine, Ser: 0.75 mg/dL (ref 0.40–1.20)
GFR: 79.35 mL/min (ref >60.00)
Glucose, Bld: 105 mg/dL — ABNORMAL HIGH (ref 70–99)
Potassium: 4.9 mEq/L (ref 3.5–5.1)
Sodium: 141 mEq/L (ref 135–145)
Total Bilirubin: 0.4 mg/dL (ref 0.2–1.2)
Total Protein: 7.6 g/dL (ref 6.0–8.3)

## 2018-10-24 LAB — TSH: TSH: 4.13 u[IU]/mL (ref 0.35–4.50)

## 2018-10-24 LAB — LIPID PANEL
Cholesterol: 209 mg/dL — ABNORMAL HIGH (ref 0–200)
HDL: 54.1 mg/dL (ref >39.00)
LDL Cholesterol: 120 mg/dL — ABNORMAL HIGH (ref 0–99)
NonHDL: 155
Total CHOL/HDL Ratio: 4
Triglycerides: 177 mg/dL — ABNORMAL HIGH (ref 0.0–149.0)
VLDL: 35.4 mg/dL (ref 0.0–40.0)

## 2018-10-24 LAB — HEMOGLOBIN A1C: Hgb A1c MFr Bld: 5.6 % (ref 4.6–6.5)

## 2018-10-24 LAB — VITAMIN D 25 HYDROXY (VIT D DEFICIENCY, FRACTURES): VITD: 17.14 ng/mL — ABNORMAL LOW (ref 30.00–100.00)

## 2018-10-24 MED ORDER — VITAMIN D (ERGOCALCIFEROL) 1.25 MG (50000 UNIT) PO CAPS: 50000.0000 [IU] | ORAL_CAPSULE | ORAL | 0 refills | Status: DC

## 2018-10-24 NOTE — Telephone Encounter (Signed)
Please inform patient the following information: Her cell counts are normal. Her cholesterol level is good. Her diabetes screen is normal. Her liver, kidney, electrolyte and thyroid function is normal.  The alkaline phosphatase is still elevated above normal, which is chronic for her and is stable. Her vitamin D is extremely low at 20.  It should be between 30-50.  I have prescribed the high dose once weekly vitamin D for her to take with food for 12 weeks.  She should also start in over-the-counter 1000 mg vitamin D after her prescribed medicine is completed to maintain the level. She will need to continue the OTC vit D everyday.

## 2018-10-24 NOTE — Patient Instructions (Addendum)
Great to see you today.  We will call you with lab results.  As long as all labs normal - follow up in 1 year for physical- unless needed sooner.  You received your flu shot today and your tetanus booster.  Your mammogram was ordered for November.  I strongly urge you to have your colon cancer screening.      Health Maintenance, Female Adopting a healthy lifestyle and getting preventive care are important in promoting health and wellness. Ask your health care provider about:  The right schedule for you to have regular tests and exams.  Things you can do on your own to prevent diseases and keep yourself healthy. What should I know about diet, weight, and exercise? Eat a healthy diet   Eat a diet that includes plenty of vegetables, fruits, low-fat dairy products, and lean protein.  Do not eat a lot of foods that are high in solid fats, added sugars, or sodium. Maintain a healthy weight Body mass index (BMI) is used to identify weight problems. It estimates body fat based on height and weight. Your health care provider can help determine your BMI and help you achieve or maintain a healthy weight. Get regular exercise Get regular exercise. This is one of the most important things you can do for your health. Most adults should:  Exercise for at least 150 minutes each week. The exercise should increase your heart rate and make you sweat (moderate-intensity exercise).  Do strengthening exercises at least twice a week. This is in addition to the moderate-intensity exercise.  Spend less time sitting. Even light physical activity can be beneficial. Watch cholesterol and blood lipids Have your blood tested for lipids and cholesterol at 58 years of age, then have this test every 5 years. Have your cholesterol levels checked more often if:  Your lipid or cholesterol levels are high.  You are older than 58 years of age.  You are at high risk for heart disease. What should I know about  cancer screening? Depending on your health history and family history, you may need to have cancer screening at various ages. This may include screening for:  Breast cancer.  Cervical cancer.  Colorectal cancer.  Skin cancer.  Lung cancer. What should I know about heart disease, diabetes, and high blood pressure? Blood pressure and heart disease  High blood pressure causes heart disease and increases the risk of stroke. This is more likely to develop in people who have high blood pressure readings, are of African descent, or are overweight.  Have your blood pressure checked: ? Every 3-5 years if you are 55-32 years of age. ? Every year if you are 29 years old or older. Diabetes Have regular diabetes screenings. This checks your fasting blood sugar level. Have the screening done:  Once every three years after age 67 if you are at a normal weight and have a low risk for diabetes.  More often and at a younger age if you are overweight or have a high risk for diabetes. What should I know about preventing infection? Hepatitis B If you have a higher risk for hepatitis B, you should be screened for this virus. Talk with your health care provider to find out if you are at risk for hepatitis B infection. Hepatitis C Testing is recommended for:  Everyone born from [birth year] through 1965.  Anyone with known risk factors for hepatitis C. Sexually transmitted infections (STIs)  Get screened for STIs, including gonorrhea and chlamydia, if: ?  You are sexually active and are younger than 58 years of age. ? You are older than 58 years of age and your health care provider tells you that you are at risk for this type of infection. ? Your sexual activity has changed since you were last screened, and you are at increased risk for chlamydia or gonorrhea. Ask your health care provider if you are at risk.  Ask your health care provider about whether you are at high risk for HIV. Your health care  provider may recommend a prescription medicine to help prevent HIV infection. If you choose to take medicine to prevent HIV, you should first get tested for HIV. You should then be tested every 3 months for as long as you are taking the medicine. Pregnancy  If you are about to stop having your period (premenopausal) and you may become pregnant, seek counseling before you get pregnant.  Take 400 to 800 micrograms (mcg) of folic acid every day if you become pregnant.  Ask for birth control (contraception) if you want to prevent pregnancy. Osteoporosis and menopause Osteoporosis is a disease in which the bones lose minerals and strength with aging. This can result in bone fractures. If you are 77 years old or older, or if you are at risk for osteoporosis and fractures, ask your health care provider if you should:  Be screened for bone loss.  Take a calcium or vitamin D supplement to lower your risk of fractures.  Be given hormone replacement therapy (HRT) to treat symptoms of menopause. Follow these instructions at home: Lifestyle  Do not use any products that contain nicotine or tobacco, such as cigarettes, e-cigarettes, and chewing tobacco. If you need help quitting, ask your health care provider.  Do not use street drugs.  Do not share needles.  Ask your health care provider for help if you need support or information about quitting drugs. Alcohol use  Do not drink alcohol if: ? Your health care provider tells you not to drink. ? You are pregnant, may be pregnant, or are planning to become pregnant.  If you drink alcohol: ? Limit how much you use to 0-1 drink a day. ? Limit intake if you are breastfeeding.  Be aware of how much alcohol is in your drink. In the [place] one drink equals one 12 oz bottle of beer (355 mL), one 5 oz glass of wine (148 mL), or one 1 oz glass of hard liquor (44 mL). General instructions  Schedule regular health, dental, and eye exams.  Stay current  with your vaccines.  Tell your health care provider if: ? You often feel depressed. ? You have ever been abused or do not feel safe at home. Summary  Adopting a healthy lifestyle and getting preventive care are important in promoting health and wellness.  Follow your health care provider's instructions about healthy diet, exercising, and getting tested or screened for diseases.  Follow your health care provider's instructions on monitoring your cholesterol and blood pressure. This information is not intended to replace advice given to you by your health care provider. Make sure you discuss any questions you have with your health care provider. Document Released: 08/23/2010 Document Revised: 01/31/2018 Document Reviewed: 01/31/2018 Elsevier Patient Education  2020 [employer].

## 2018-10-24 NOTE — Progress Notes (Signed)
Patient ID: [name], female  DOB: January 06, 1961, 58 y.o.   MRN: [id number] Patient Care Team    Relationship Specialty Notifications Start End  Ma Hillock, DO PCP - General Family Medicine  09/01/16   Ward Givens, NP Registered Nurse Gerontology  09/01/16    Comment: seizures   Jettie Booze, MD Consulting Physician Cardiology  09/06/16     Chief Complaint  Patient presents with  . Annual Exam    fasting. wants flu shot. mamm 12/2017. dexa 12/2017. she has a cologuard at home that she will do.     Subjective:  [name] is a 58 y.o.  Female  present for CPE. All past medical history, surgical history, allergies, family history, immunizations, medications and social history were updated in the electronic medical record today. All recent labs, ED visits and hospitalizations within the last year were reviewed.  Health maintenance:  Colonoscopy: Never. No Fhx.--> cologuard ordered last two years and pt has not completed either>> ordered again today Mammogram:M.aunt Breast ca/mother breast cancer 2019. completed: [facility]. Cervical cancer screening: N/A hysterectomy for menorrhagia. CA125 NL 2018. MRI 2018 5. No suspicious adnexal masses. Tubular left adnexal cystic structure is significantly decreased in size since 2014 CT abdomen/pelvis study, compatible with a nearly resolved left hydrosalpinx. Immunizations: td updated today 10/24/2018, Influenza completed today(encouraged yearly),Shingrix series completed  Infectious disease screening: HIV declined DEXA:01/10/2018- [facility] - [facility] Assistive device: none Oxygen ION:GEXB Patient has a Dental home. Hospitalizations/ED visits: reviewed  Depression screen [facility] 2/9 10/24/2018 10/06/2017 08/14/2017 10/19/2016 10/05/2016  Decreased Interest 1 0 0 0 0  Down, Depressed, Hopeless 0 0 0 0 0  PHQ - 2 Score 1 0 0 0 0  Altered sleeping - - - 0 -  Tired, decreased energy - - - 2 -  Change  in appetite - - - 0 -  Feeling bad or failure about yourself  - - - 0 -  Trouble concentrating - - - 0 -  Moving slowly or fidgety/restless - - - 0 -  Suicidal thoughts - - - 0 -  PHQ-9 Score - - - 2 -  Difficult doing work/chores - - - Not difficult at all -   No flowsheet data found.         Immunization History  Administered Date(s) Administered  . Influenza,inj,Quad PF,6+ Mos 10/19/2016, 01/05/2018  . Influenza-Unspecified 11/06/2012, 12/10/2015  . Tdap 03/10/2009  . Zoster Recombinat (Shingrix) 10/06/2017, 01/05/2018     Past Medical History:  Diagnosis Date  . Allergy   . Arthritis   . Chicken pox   . H/O echocardiogram 12/10/2008   Normal; EF55%  . H/O exercise stress test 12/10/2008   Poor R-wave progression, ST '@baseline'$ , No ECG changes indicative of ischemia. Nomral HR recovery. No sx of ischemia. Dr. Irish Lack  . 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    No Known Allergies Past Surgical History:  Procedure Laterality Date  . OOPHORECTOMY Right 2006   x1 only with hysterectomy.   . OVARIAN CYST REMOVAL  1994  . PARTIAL HYSTERECTOMY  2006   Menorrhagia/fibroid   Family History  Problem Relation Age of Onset  . Breast cancer Mother 10  . COPD Father 14       Emphysema  . Early death Father   . Breast cancer Maternal Aunt 81   Social History   Social History Narrative  Patient lives at home with husband [name], and 2 adult children.   Patient has a high school education. She is a Agricultural engineer.   Patient drinks caffeine.   She wears her seatbelt. Smoke detectors in the home.   Firearms locked in the home.   Feels safe in her relationships.       Allergies as of 10/24/2018   No Known Allergies     Medication List       Accurate as of October 24, 2018  9:01 AM. If you have any questions, ask your nurse or doctor.        STOP taking these medications   Cholecalciferol  25 MCG (1000 UT) tablet Stopped by: Howard Pouch, DO     TAKE these medications   PHENobarbital 64.8 MG tablet Commonly known as: LUMINAL Take 2 tablets (129.6 mg total) by mouth at bedtime.       All past medical history, surgical history, allergies, family history, immunizations andmedications were updated in the EMR today and reviewed under the history and medication portions of their EMR.     No results found for this or any previous visit (from the past 2160 hour(s)).   Mm 3d Screen Breast Bilateral  Result Date: 01/11/2018 CLINICAL DATA:  Screening. EXAM: DIGITAL SCREENING BILATERAL MAMMOGRAM WITH TOMO AND CAD COMPARISON:  Previous exam(s). ACR Breast Density Category c: The breast tissue is heterogeneously dense, which may obscure small masses. FINDINGS: There are no findings suspicious for malignancy. Images were processed with CAD. IMPRESSION: No mammographic evidence of malignancy. A result letter of this screening mammogram will be mailed directly to the patient. RECOMMENDATION: Screening mammogram in one year. (Code:SM-B-01Y) BI-RADS CATEGORY  1: Negative. Electronically Signed   By: Lajean Manes M.D.   On: 01/11/2018 11:18     ROS: 14 pt review of systems performed and negative (unless mentioned in an HPI)  Objective: BP 110/71 (BP Location: Right Arm, Patient Position: Sitting, Cuff Size: Normal)   Pulse 80   Temp (!) 97.2 F (36.2 C) (Temporal)   Resp 17   Ht 5' 4.37" (1.635 m)   Wt 160 lb 4 oz (72.7 kg)   SpO2 98%   BMI 27.19 kg/m  Gen: Afebrile. No acute distress. Nontoxic in appearance, well-developed, well-nourished,  Pleasant caucasian female.  HENT: AT. [place]. Bilateral TM visualized and normal in appearance, normal external auditory canal. MMM, no oral lesions, adequate dentition. Bilateral nares within normal limits. Throat without erythema, ulcerations or exudates. no Cough on exam, no hoarseness on exam. Eyes:Pupils Equal Round Reactive to light,  Extraocular movements intact,  Conjunctiva without redness, discharge or icterus. Neck/lymp/endocrine: Supple,no lymphadenopathy, no thyromegaly CV: RRR no murmur, no edema, +2/4 P posterior tibialis pulses. no carotid bruits. No JVD. Chest: CTAB, no wheeze, rhonchi or crackles. normal Respiratory effort. good Air movement. Abd: Soft. NTND. BS present. no Masses palpated. No hepatosplenomegaly. No rebound tenderness or guarding. Skin: no rashes, purpura or petechiae. Warm and well-perfused. Skin intact. Neuro/Msk:  Normal gait. PERLA. EOMi. Alert. Oriented x3.  Cranial nerves II through XII intact. Muscle strength 5/5 upper/lower extremity. DTRs equal bilaterally. Psych: Normal affect, dress and demeanor. Normal speech. Normal thought content and judgment.   No exam data present  Assessment/plan: DELOIS SILVESTER is a 58 y.o. female present for CPE Breast cancer screening - MM 3D SCREEN BREAST BILATERAL; Future  Colon cancer screening Strongly encouraged her to complete. New order placed.  - Cologuard  Influenza vaccine needed Administered today  Need for vaccination Td updated today  Vitamin D deficiency She currently is not taking the vit d 1000u recommended.  - Vitamin D (25 hydroxy)  Encounter for therapeutic drug monitoring - phenobarb prescribed by neuro- has had chronic elevated alk phos felt to be secondary to medication.  - CBC w/Diff - Comp Met (CMET) - TSH  Overweight (BMI 25.0-29.9) Routine exercise and diet recommended.  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 Patient was encouraged to choose a diet filled with fresh fruits and vegetables, and lean meats. AVS provided to patient today  for education/recommendation on gender spColonoscopy: Never. No Fhx.--> cologuard ordered last two years and pt has not completed either>> ordered again today Mammogram:M.aunt Breast ca/mother breast cancer 2019. completed: 12/2017 [facility]. Cervical cancer screening: N/A hysterectomy for menorrhagia. CA125 NL 2018. Immunizations: td updated today 10/24/2018, Influenza completed today(encouraged yearly),Shingrix series completed  Infectious disease screening: HIV declined DEXA:01/10/2018- [facility] - [facility] health and safety maintenance.  Return in about 1 year (around 10/24/2019) for CPE (30 min).  Electronically signed by: Howard Pouch, DO [place]

## 2018-10-25 ENCOUNTER — Telehealth: Payer: Self-pay

## 2018-10-25 NOTE — Telephone Encounter (Signed)
Cologuard order placed.

## 2018-10-25 NOTE — Telephone Encounter (Signed)
Called patient and she verbalized understanding  °

## 2018-10-30 ENCOUNTER — Telehealth: Payer: Self-pay | Admitting: Family Medicine

## 2018-10-30 NOTE — Telephone Encounter (Signed)
Pt noticed this AM that pt has small red bump on arm. No pain, not draining, no swelling, not warm to touch. Pt was advised to monitor and to call back if she had any of the following symptoms. She verbalized understanding

## 2018-10-30 NOTE — Telephone Encounter (Signed)
Tet shot last week, she reports there is a small bump at injection site.  Please call patient [phone]  Thank you

## 2018-12-05 ENCOUNTER — Encounter: Payer: Self-pay | Admitting: Adult Health

## 2018-12-05 ENCOUNTER — Other Ambulatory Visit: Payer: Self-pay

## 2018-12-05 ENCOUNTER — Ambulatory Visit (INDEPENDENT_AMBULATORY_CARE_PROVIDER_SITE_OTHER): Payer: 59 | Admitting: Adult Health

## 2018-12-05 VITALS — BP 137/77 | HR 70 | Temp 97.9°F | Ht 64.0 in | Wt 164.0 lb

## 2018-12-05 DIAGNOSIS — G40109 Localization-related (focal) (partial) symptomatic epilepsy and epileptic syndromes with simple partial seizures, not intractable, without status epilepticus: Secondary | ICD-10-CM | POA: Diagnosis not present

## 2018-12-05 NOTE — Progress Notes (Signed)
PATIENT: [name] DOB: 01-18-1961  REASON FOR VISIT: follow up HISTORY FROM: patient  HISTORY OF PRESENT ILLNESS: Today 12/05/18:  Ms. Koziel is a 58 year old female with a history of seizures.  She returns today for follow-up.  She remains on phenobarbital and tolerates it well.  She denies any seizure events.  She does not operate a motor vehicle.  She is able to complete all ADLs independently.  She denies any changes in her gait or balance.  No change in her mood.  She does state that at times she is forgetful.  She states that she may walk into a room and forget what she went in for but then later can recall it.  She denies any new issues.  She returns today for an evaluation.   REVIEW OF SYSTEMS: Out of a complete 14 system review of symptoms, the patient complains only of the following symptoms, and all other reviewed systems are negative.  see HPI  ALLERGIES: No Known Allergies  HOME MEDICATIONS: Outpatient Medications Prior to Visit  Medication Sig Dispense Refill  . PHENobarbital (LUMINAL) 64.8 MG tablet Take 2 tablets (129.6 mg total) by mouth at bedtime. 180 tablet 1  . Vitamin D, Ergocalciferol, (DRISDOL) 1.25 MG (50000 UT) CAPS capsule Take 1 capsule (50,000 Units total) by mouth every 7 (seven) days. 12 capsule 0   No facility-administered medications prior to visit.     PAST MEDICAL HISTORY: Past Medical History:  Diagnosis Date  . Allergy   . Arthritis   . Chicken pox   . H/O echocardiogram 12/10/2008   Normal; EF55%  . H/O exercise stress test 12/10/2008   Poor R-wave progression, ST @baseline , No ECG changes indicative of ischemia. Nomral HR recovery. No sx of ischemia. Dr. Irish Lack  . 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     PAST SURGICAL HISTORY: Past Surgical History:  Procedure Laterality Date  . OOPHORECTOMY Right 2006   x1 only with hysterectomy.   .  OVARIAN CYST REMOVAL  1994  . PARTIAL HYSTERECTOMY  2006   Menorrhagia/fibroid    FAMILY HISTORY: Family History  Problem Relation Age of Onset  . Breast cancer Mother 17  . COPD Father 66       Emphysema  . Early death Father   . Breast cancer Maternal Aunt 50    SOCIAL HISTORY: Social History   Socioeconomic History  . Marital status: Married    Spouse name: [name]   . Number of children: 1  . Years of education: hs  . Highest education level: Not on file  Occupational History  . Occupation: Agricultural engineer  Social Needs  . Financial resource strain: Not on file  . Food insecurity    Worry: Not on file    Inability: Not on file  . Transportation needs    Medical: Not on file    Non-medical: Not on file  Tobacco Use  . Smoking status: Never Smoker  . Smokeless tobacco: Never Used  Substance and Sexual Activity  . Alcohol use: No  . Drug use: No  . Sexual activity: Never    Partners: Male  Lifestyle  . Physical activity    Days per week: Not on file    Minutes per session: Not on file  . Stress: Not on file  Relationships  . Social Herbalist on phone: Not on file    Gets together: Not on  file    Attends religious service: Not on file    Active member of club or organization: Not on file    Attends meetings of clubs or organizations: Not on file    Relationship status: Not on file  . Intimate partner violence    Fear of current or ex partner: Not on file    Emotionally abused: Not on file    Physically abused: Not on file    Forced sexual activity: Not on file  Other Topics Concern  . Not on file  Social History Narrative   Patient lives at home with husband [name], and 2 adult children.   Patient has a high school education. She is a Agricultural engineer.   Patient drinks caffeine.   She wears her seatbelt. Smoke detectors in the home.   Firearms locked in the home.   Feels safe in her relationships.         PHYSICAL EXAM  Vitals:   12/05/18 0839   BP: 137/77  Pulse: 70  Temp: 97.9 F (36.6 C)  TempSrc: Oral  Weight: 164 lb (74.4 kg)  Height: 5\' 4"  (1.626 m)   Body mass index is 28.15 kg/m.  Generalized: Well developed, in no acute distress   Neurological examination  Mentation: Alert oriented to time, place, history taking. Follows all commands speech and language fluent Cranial nerve II-XII: Pupils were equal round reactive to light. Extraocular movements were full, visual field were full on confrontational test.Head turning and shoulder shrug  were normal and symmetric. Motor: The motor testing reveals 5 over 5 strength of all 4 extremities. Good symmetric motor tone is noted throughout.  Sensory: Sensory testing is intact to soft touch on all 4 extremities. No evidence of extinction is noted.  Coordination: Cerebellar testing reveals good finger-nose-finger and heel-to-shin bilaterally.  Gait and station: Gait is normal. Reflexes: Deep tendon reflexes are symmetric and normal bilaterally.   DIAGNOSTIC DATA (LABS, IMAGING, TESTING) - I reviewed patient records, labs, notes, testing and imaging myself where available.  Lab Results  Component Value Date   WBC 5.3 10/24/2018   HGB 13.0 10/24/2018   HCT 39.8 10/24/2018   MCV 88.3 10/24/2018   PLT 236.0 10/24/2018      Component Value Date/Time   NA 141 10/24/2018 0905   NA 141 11/30/2017 1101   K 4.9 10/24/2018 0905   CL 104 10/24/2018 0905   CO2 30 10/24/2018 0905   GLUCOSE 105 (H) 10/24/2018 0905   BUN 9 10/24/2018 0905   BUN 10 11/30/2017 1101   CREATININE 0.75 10/24/2018 0905   CALCIUM 9.6 10/24/2018 0905   PROT 7.6 10/24/2018 0905   PROT 7.2 11/30/2017 1101   ALBUMIN 4.4 10/24/2018 0905   ALBUMIN 4.3 11/30/2017 1101   AST 23 10/24/2018 0905   ALT 34 10/24/2018 0905   ALKPHOS 159 (H) 10/24/2018 0905   BILITOT 0.4 10/24/2018 0905   BILITOT 0.3 11/30/2017 1101   GFRNONAA 95 11/30/2017 1101   GFRAA 109 11/30/2017 1101   Lab Results  Component Value  Date   CHOL 209 (H) 10/24/2018   HDL 54.10 10/24/2018   LDLCALC 120 (H) 10/24/2018   TRIG 177.0 (H) 10/24/2018   CHOLHDL 4 10/24/2018   Lab Results  Component Value Date   HGBA1C 5.6 10/24/2018   No results found for: [id number] Lab Results  Component Value Date   TSH 4.13 10/24/2018      ASSESSMENT AND PLAN 58 y.o. 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 here with:  1.  Seizures  Overall the patient is doing well.  She will continue on phenobarbital.  I will check blood work today.  She is advised that if her symptoms worsen or she develops new symptoms she should let us know.  She will follow-up in 1 year or sooner if needed.   I spent 15 minutes with the patient. 50% of this time was spent reviewing plan of care   Ward Givens, MSN, NP-C 12/05/2018, 8:51 AM [facility] [address], [place], [place] [address] [phone]

## 2018-12-05 NOTE — Progress Notes (Signed)
I have read the note, and I agree with the clinical assessment and plan.  [name]   

## 2018-12-05 NOTE — Patient Instructions (Signed)
Your Plan:  Continue phenobarbital Blood work today  Thank you for coming to see us at [facility]. I hope we have been able to provide you high quality care today.  You may receive a patient satisfaction survey over the next few weeks. We would appreciate your feedback and comments so that we may continue to improve ourselves and the health of our patients.  

## 2018-12-06 ENCOUNTER — Telehealth: Payer: Self-pay

## 2018-12-06 LAB — PHENOBARBITAL LEVEL: Phenobarbital, Serum: 23 ug/mL (ref 15–40)

## 2018-12-06 NOTE — Telephone Encounter (Signed)
Spoke with the patient and she verbalized understanding her results. No questions or concerns at this time.   

## 2018-12-06 NOTE — Telephone Encounter (Signed)
-----   Message from Ward Givens, NP sent at 12/06/2018  4:01 PM EDT ----- Labs results are unremarkable. Please call patient with results.

## 2019-01-14 ENCOUNTER — Ambulatory Visit (HOSPITAL_BASED_OUTPATIENT_CLINIC_OR_DEPARTMENT_OTHER)
Admission: RE | Admit: 2019-01-14 | Discharge: 2019-01-14 | Disposition: A | Discharge status: Home / Self Care | Payer: 59 | Source: Ambulatory Visit | Attending: Family Medicine | Admitting: Family Medicine

## 2019-01-14 ENCOUNTER — Other Ambulatory Visit: Payer: Self-pay

## 2019-01-14 DIAGNOSIS — Z1239 Encounter for other screening for malignant neoplasm of breast: Secondary | ICD-10-CM | POA: Diagnosis present

## 2019-01-14 DIAGNOSIS — Z1231 Encounter for screening mammogram for malignant neoplasm of breast: Secondary | ICD-10-CM | POA: Diagnosis not present

## 2019-02-05 ENCOUNTER — Telehealth: Payer: Self-pay | Admitting: Family Medicine

## 2019-02-05 ENCOUNTER — Ambulatory Visit (INDEPENDENT_AMBULATORY_CARE_PROVIDER_SITE_OTHER): Payer: 59 | Admitting: Family Medicine

## 2019-02-05 ENCOUNTER — Encounter: Payer: Self-pay | Admitting: Family Medicine

## 2019-02-05 ENCOUNTER — Other Ambulatory Visit: Payer: Self-pay

## 2019-02-05 DIAGNOSIS — R432 Parageusia: Secondary | ICD-10-CM | POA: Diagnosis not present

## 2019-02-05 DIAGNOSIS — R5383 Other fatigue: Secondary | ICD-10-CM

## 2019-02-05 DIAGNOSIS — B349 Viral infection, unspecified: Secondary | ICD-10-CM

## 2019-02-05 NOTE — Patient Instructions (Signed)

## 2019-02-05 NOTE — Telephone Encounter (Signed)
Options are to message the P EC pool, and asked to [facility] for tomorrow.  Otherwise, we will have to encourage patient to seek testing outside of our system such as [facility] or [facility] in [place] have testing by appointment, with online set up.   Please ask him for full details on how to contact admin pool

## 2019-02-05 NOTE — Telephone Encounter (Signed)
Maudie Mercury can you help with this please?

## 2019-02-05 NOTE — Telephone Encounter (Signed)
Error

## 2019-02-05 NOTE — Progress Notes (Signed)
   VIRTUAL VISIT VIA VIDEO  I connected with [name] on 02/05/19 at  4:00 PM EST by a video enabled telemedicine application and verified that I am speaking with the correct person using two identifiers. Location patient: Home Location provider: [facility], Office Persons participating in the virtual visit: Patient, Dr. Raoul Pitch and R.Baker, LPN  I discussed the limitations of evaluation and management by telemedicine and the availability of in person appointments. The patient expressed understanding and agreed to proceed.   SUBJECTIVE Chief Complaint  Patient presents with  . Loss of taste/smell    since last Wednesday  . Fatigue  . Mucus in throat    HPI: [name] is a 58 y.o.-year-old female present today via virtual video for acute symptoms of fatigue, watery eyes, decreased appetite, diarrhea and loss of taste and smell.  Patient reports her symptoms started Wednesday, December 9.  She was around someone that was ill approximately 2 weeks ago.  She has been using Mucinex over-the-counter to help with her symptoms.  She denies cough, shortness of breath, fever or chills. ROS: See pertinent positives and negatives per HPI.  Patient Active Problem List   Diagnosis Date Noted  . Vitamin D deficiency 10/09/2017  . Asthma due to environmental allergies 10/06/2017  . Elevated alkaline phosphatase level 10/06/2017  . 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 Overweight (BMI 25.0-29.9) 09/02/2016  . 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 Encounter for therapeutic drug monitoring 11/23/2012    Social History   Tobacco Use  . Smoking status: Never Smoker  . Smokeless tobacco: Never Used  Substance Use Topics  . Alcohol use: No    Current Outpatient Medications:  .  PHENobarbital (LUMINAL) 64.8 MG tablet, Take 2 tablets (129.6 mg total) by mouth at bedtime., Disp: 180 tablet, Rfl: 1 .   Vitamin D, Ergocalciferol, (DRISDOL) 1.25 MG (50000 UT) CAPS capsule, Take 1 capsule (50,000 Units total) by mouth every 7 (seven) days., Disp: 12 capsule, Rfl: 0  No Known Allergies  OBJECTIVE: There were no vitals taken for this visit. Gen: No acute distress. Nontoxic in appearance.  HENT: AT. [place].  MMM.  Eyes:Pupils Equal Round Reactive to light, Extraocular movements intact,  Conjunctiva without redness, discharge or icterus. Chest: Cough or shortness of breath not present on exam Skin: No rashes, purpura or petechiae.  Neuro:Alert. Oriented x3    ASSESSMENT AND PLAN: [name] is a 58 y.o. female present for  Loss of taste/fatigue/viral illness Discussed her signs and symptoms are classic for COVID-19 with loss of taste/smell.  Patient was provided with contact information to schedule her testing at [facility]. She was encouraged to rest, hydrate both with water and G2. Continue Mucinex. use over-the-counter Tylenol or Advil as needed. - Novel Coronavirus, NAA ([facility]) -If positive test result, quarantine for 14 days from onset of symptoms which would be December 23.  If remains symptom-free greater than 72 hours on December 23 May end self-isolation. -If negative test, likely viral.  Continue instructions per above and if symptoms progress greater than 10 days, would then consider treatment with follow-up. Patient was informed of signs and symptoms that would warrant emergent attention and ED follow-up.  She reported understanding. > 15 minutes spent with patient, > 50% of that time face to face    Howard Pouch, DO 02/05/2019

## 2019-02-05 NOTE — Telephone Encounter (Signed)
Pt called and said The earliest appt at the [facility] location for a covid test is not until Friday afternoon. Pt wanted Dr Raoul Pitch to know. [facility] is not open today and tomorrow. Pt not familiar with [facility].

## 2019-02-06 ENCOUNTER — Ambulatory Visit: Payer: 59 | Attending: Internal Medicine

## 2019-02-06 ENCOUNTER — Other Ambulatory Visit: Payer: Self-pay | Admitting: Cardiology

## 2019-02-06 DIAGNOSIS — Z20822 Contact with and (suspected) exposure to covid-19: Secondary | ICD-10-CM

## 2019-02-06 NOTE — Telephone Encounter (Signed)
Spoke with patient regarding testing.[name] has availability today at 3:45pm. Patient was able to schedule herself using website.

## 2019-02-08 ENCOUNTER — Other Ambulatory Visit: Payer: 59

## 2019-02-08 ENCOUNTER — Telehealth: Payer: Self-pay | Admitting: Family Medicine

## 2019-02-08 LAB — NOVEL CORONAVIRUS, NAA: SARS-CoV-2, NAA: DETECTED — AB

## 2019-02-08 NOTE — Telephone Encounter (Signed)
Patient was called this evening by this provider and given test results.  She is positive for COVID-19.  Her husband has yet to receive his results.  I would presume he is also positive since they were sharing the same living space in encouraged them both to continue quarantining for 2 weeks which would end on the 23rd. She was given instructions on emergent follow-up needs.

## 2019-02-20 ENCOUNTER — Ambulatory Visit (INDEPENDENT_AMBULATORY_CARE_PROVIDER_SITE_OTHER): Payer: 59 | Admitting: Family Medicine

## 2019-02-20 ENCOUNTER — Encounter: Payer: Self-pay | Admitting: Family Medicine

## 2019-02-20 VITALS — Ht 64.0 in | Wt 155.0 lb

## 2019-02-20 DIAGNOSIS — B9689 Other specified bacterial agents as the cause of diseases classified elsewhere: Secondary | ICD-10-CM

## 2019-02-20 DIAGNOSIS — J329 Chronic sinusitis, unspecified: Secondary | ICD-10-CM | POA: Diagnosis not present

## 2019-02-20 MED ORDER — AMOXICILLIN-POT CLAVULANATE 875-125 MG PO TABS: 1.0000 | ORAL_TABLET | Freq: Two times a day (BID) | ORAL | 0 refills | Status: DC

## 2019-02-20 MED ORDER — PREDNISONE 20 MG PO TABS: ORAL_TABLET | ORAL | 0 refills | Status: DC

## 2019-02-20 NOTE — Progress Notes (Signed)
VIRTUAL VISIT VIA VIDEO  I connected with [name] on 02/20/19 at  1:15 PM EST by a video enabled telemedicine application and verified that I am speaking with the correct person using two identifiers. Location patient: Home Location provider: [facility], Office Persons participating in the virtual visit: Patient, Dr. Raoul Pitch and R.Baker, LPN  I discussed the limitations of evaluation and management by telemedicine and the availability of in person appointments. The patient expressed understanding and agreed to proceed.   SUBJECTIVE Chief Complaint  Patient presents with  . Nasal Congestion    Pt has had for 1.5 weeks. Denies fever, cough, or SOB.   . Facial Pain  . Headache    HPI: [name] is a 58 y.o.-year-old female present today via virtual video for follow up after visit 12/15/29020 for acute symptoms of fatigue, watery eyes, decreased appetite, diarrhea and loss of taste and smell.  Patient reports her symptoms started Wednesday, December 9.  She was around someone that was ill approximately 2 weeks ago.  She has been using Mucinex over-the-counter to help with her symptoms.  She denied cough, shortness of breath, fever or chills at that time.  She tested positive for coronavirus 02/06/2019.  Samule Dry would have ended for her on December 23 from her onset of symptoms.  Today patient reports continued nasal congestion, facial pain and headache.  Her sense of smell is starting to return, however her sense of taste is still decreased.  She has been using Mucinex and over-the-counter sinus medication.  She states her facial pain is worsening and feels like when she is had prior sinus infections.  She denies fever.  ROS: See pertinent positives and negatives per HPI.  Patient Active Problem List   Diagnosis Date Noted  . Vitamin D deficiency 10/09/2017  . Asthma due to environmental allergies 10/06/2017  . Elevated alkaline phosphatase level 10/06/2017  .  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 Overweight (BMI 25.0-29.9) 09/02/2016  . 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 Encounter for therapeutic drug monitoring 11/23/2012    Social History   Tobacco Use  . Smoking status: Never Smoker  . Smokeless tobacco: Never Used  Substance Use Topics  . Alcohol use: No    Current Outpatient Medications:  .  PHENobarbital (LUMINAL) 64.8 MG tablet, Take 2 tablets (129.6 mg total) by mouth at bedtime., Disp: 180 tablet, Rfl: 1 .  Vitamin D, Ergocalciferol, (DRISDOL) 1.25 MG (50000 UT) CAPS capsule, Take 1 capsule (50,000 Units total) by mouth every 7 (seven) days., Disp: 12 capsule, Rfl: 0  No Known Allergies  OBJECTIVE: Ht 5\' 4"  (1.626 m)   Wt 155 lb (70.3 kg)   BMI 26.61 kg/m  Gen: Afebrile. No acute distress.  Sounds congested. HENT: AT. [place].  MMM.  Eyes:Pupils Equal Round Reactive to light, Extraocular movements intact,  Conjunctiva without redness, discharge or icterus. Chest: No cough or shortness of breath present. Skin: No rashes, purpura or petechiae.  Neuro:  Normal gait. PERLA. EOMi. Alert. Oriented.  Psych: Normal affect, dress and demeanor. Normal speech. Normal thought content and judgment.   ASSESSMENT AND PLAN: [name] is a 58 y.o. female present for  Bacterial sinusitis after viral illness: Sinus infection after COVID-19 infection. She was encouraged to rest, hydrate both with water and G2. Continue Mucinex. use over-the-counter Tylenol or Advil as needed. -Novel Coronavirus> Positive 02/06/2019-reviewed +/- flonase, mucinex (DM if cough), nettie  pot or nasal saline.  Augmentin and prednisone prescribed, take until completed.  If cough present it can last up to 6-8 weeks.  F/U 2 weeks of not improved.      > 25 minutes spent with patient, >50% of time spent face to face    Howard Pouch, DO 02/20/2019

## 2019-02-21 ENCOUNTER — Telehealth: Payer: Self-pay

## 2019-02-21 ENCOUNTER — Other Ambulatory Visit: Payer: Self-pay | Admitting: Adult Health

## 2019-02-21 NOTE — Telephone Encounter (Signed)
Pt was called and needed clarification on how to take medication that was RX yesterday. Clarification was given to patient, she was told to call pharmacy over the holiday with any medication questions

## 2019-02-21 NOTE — Telephone Encounter (Signed)
Patient is requesting a call back about predniSONE (DELTASONE) 20 MG tablet

## 2019-02-25 ENCOUNTER — Other Ambulatory Visit: Payer: Self-pay | Admitting: Adult Health

## 2019-02-25 MED ORDER — PHENOBARBITAL 64.8 MG PO TABS: 129.6000 mg | ORAL_TABLET | Freq: Every day | ORAL | 1 refills | Status: DC

## 2019-02-25 NOTE — Telephone Encounter (Signed)
Pt is needing a refill on her PHENobarbital (LUMINAL) 64.8 MG tablet sent in to the [facility] on [address]

## 2019-08-23 ENCOUNTER — Other Ambulatory Visit: Payer: Self-pay | Admitting: Adult Health

## 2019-08-23 ENCOUNTER — Other Ambulatory Visit: Payer: Self-pay | Admitting: Neurology

## 2019-08-23 NOTE — Telephone Encounter (Signed)
I returned call from answering service from pt asking for refill of phenobarbital . I  checked her chart and sent refill to her listed pharmacy. I called patient and left message for her to pick it up from her pharmacy.

## 2019-12-04 ENCOUNTER — Other Ambulatory Visit: Payer: Self-pay

## 2019-12-04 ENCOUNTER — Ambulatory Visit: Payer: Self-pay | Admitting: Neurology

## 2019-12-04 ENCOUNTER — Encounter: Payer: Self-pay | Admitting: Neurology

## 2019-12-04 VITALS — BP 139/68 | HR 76 | Ht 64.0 in | Wt 155.5 lb

## 2019-12-04 DIAGNOSIS — Z5181 Encounter for therapeutic drug level monitoring: Secondary | ICD-10-CM

## 2019-12-04 DIAGNOSIS — G40109 Localization-related (focal) (partial) symptomatic epilepsy and epileptic syndromes with simple partial seizures, not intractable, without status epilepticus: Secondary | ICD-10-CM

## 2019-12-04 NOTE — Progress Notes (Signed)
Reason for visit: Seizures  [name] is an 59 y.o. female  History of present illness:  [name] is a 59 year old right-handed white female with a history of seizure disorder that has been well controlled on phenobarbital. She does not recall her last seizure, she had a lot of seizures when she was younger. She occasionally may have a staring spell but she remembers everything around her during the events, and she can respond individuals if they talk to her. The patient does not operate a motor vehicle and apparently never has. She tolerates the phenobarbital well. She had the Covid infection several months ago, she is hesitant to get the vaccination.  Past Medical History:  Diagnosis Date  . Allergy   . Arthritis   . Chicken pox   . H/O echocardiogram 12/10/2008   Normal; EF55%  . H/O exercise stress test 12/10/2008   Poor R-wave progression, ST @baseline , No ECG changes indicative of ischemia. Nomral HR recovery. No sx of ischemia. Dr. Irish Lack  . 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     Past Surgical History:  Procedure Laterality Date  . OOPHORECTOMY Right 2006   x1 only with hysterectomy.   . OVARIAN CYST REMOVAL  1994  . PARTIAL HYSTERECTOMY  2006   Menorrhagia/fibroid    Family History  Problem Relation Age of Onset  . Breast cancer Mother 24  . COPD Father 56       Emphysema  . Early death Father   . Breast cancer Maternal Aunt 74    Social history:  reports that she has never smoked. She has never used smokeless tobacco. She reports that she does not drink alcohol and does not use drugs.   No Known Allergies  Medications:  Prior to Admission medications   Medication Sig Start Date End Date Taking? Authorizing Provider  amoxicillin-clavulanate (AUGMENTIN) 875-125 MG tablet Take 1 tablet by mouth 2 (two) times daily. 02/20/19  Yes Kuneff, Renee A, DO  PHENobarbital  (LUMINAL) 64.8 MG tablet TAKE 2 TABLETS (129.6 MG TOTAL) BY MOUTH AT BEDTIME. 08/23/19  Yes Garvin Fila, MD  predniSONE (DELTASONE) 20 MG tablet 60 mg x3d, 40 mg x3d, 20 mg x2d, 10 mg x2d 02/20/19  Yes Kuneff, Renee A, DO  Vitamin D, Ergocalciferol, (DRISDOL) 1.25 MG (50000 UT) CAPS capsule Take 1 capsule (50,000 Units total) by mouth every 7 (seven) days. 10/24/18  Yes Kuneff, Renee A, DO    ROS:  Out of a complete 14 system review of symptoms, the patient complains only of the following symptoms, and all other reviewed systems are negative.  History of seizures  Blood pressure 139/68, pulse 76, height 5\' 4"  (1.626 m), weight 155 lb 8 oz (70.5 kg).  Physical Exam  General: The patient is alert and cooperative at the time of the examination.  Skin: No significant peripheral edema is noted.   Neurologic Exam  Mental status: The patient is alert and oriented x 3 at the time of the examination. The patient has apparent normal recent and remote memory, with an apparently normal attention span and concentration ability.   Cranial nerves: Facial symmetry is present. Speech is normal, no aphasia or dysarthria is noted. Extraocular movements are full. Visual fields are full.  Motor: The patient has good strength in all 4 extremities.  Sensory examination: Soft touch sensation is symmetric on the face, arms, and legs.  Coordination: The patient has  good finger-nose-finger and heel-to-shin bilaterally.  Gait and station: The patient has a normal gait. Tandem gait is normal. Romberg is negative. No drift is seen.  Reflexes: Deep tendon reflexes are symmetric.   Assessment/Plan:  1. History of seizures, well controlled  The patient will continue the phenobarbital, blood work will be done today. I have spent some time today encouraging her to get the Covid vaccination. The patient will follow up here in 1 year, sooner if needed.  Greater than 50% of the visit was spent in counseling  and coordination of care.  Face-to-face time with the patient was 20 minutes.   Jill Alexanders MD 12/04/2019 10:20 AM  [facility] [address] [place], [place] [address]  Phone [phone] Fax [phone]

## 2019-12-05 LAB — COMPREHENSIVE METABOLIC PANEL
ALT: 26 IU/L (ref 0–32)
AST: 19 IU/L (ref 0–40)
Albumin/Globulin Ratio: 1.5 (ref 1.2–2.2)
Albumin: 4.4 g/dL (ref 3.8–4.9)
Alkaline Phosphatase: 166 IU/L — ABNORMAL HIGH (ref 44–121)
BUN/Creatinine Ratio: 15 (ref 9–23)
BUN: 11 mg/dL (ref 6–24)
Bilirubin Total: 0.3 mg/dL (ref 0.0–1.2)
CO2: 26 mmol/L (ref 20–29)
Calcium: 9.9 mg/dL (ref 8.7–10.2)
Chloride: 103 mmol/L (ref 96–106)
Creatinine, Ser: 0.74 mg/dL (ref 0.57–1.00)
GFR calc Af Amer: 103 mL/min/{1.73_m2} (ref >59)
GFR calc non Af Amer: 89 mL/min/{1.73_m2} (ref >59)
Globulin, Total: 2.9 g/dL (ref 1.5–4.5)
Glucose: 102 mg/dL — ABNORMAL HIGH (ref 65–99)
Potassium: 4.8 mmol/L (ref 3.5–5.2)
Sodium: 140 mmol/L (ref 134–144)
Total Protein: 7.3 g/dL (ref 6.0–8.5)

## 2019-12-05 LAB — CBC WITH DIFFERENTIAL/PLATELET
Basophils Absolute: 0 10*3/uL (ref 0.0–0.2)
Basos: 0 %
EOS (ABSOLUTE): 0.1 10*3/uL (ref 0.0–0.4)
Eos: 2 %
Hematocrit: 39.5 % (ref 34.0–46.6)
Hemoglobin: 13 g/dL (ref 11.1–15.9)
Immature Grans (Abs): 0 10*3/uL (ref 0.0–0.1)
Immature Granulocytes: 0 %
Lymphocytes Absolute: 1.5 10*3/uL (ref 0.7–3.1)
Lymphs: 29 %
MCH: 28.6 pg (ref 26.6–33.0)
MCHC: 32.9 g/dL (ref 31.5–35.7)
MCV: 87 fL (ref 79–97)
Monocytes Absolute: 0.3 10*3/uL (ref 0.1–0.9)
Monocytes: 7 %
Neutrophils Absolute: 3.2 10*3/uL (ref 1.4–7.0)
Neutrophils: 62 %
Platelets: 222 10*3/uL (ref 150–450)
RBC: 4.54 x10E6/uL (ref 3.77–5.28)
RDW: 12.6 % (ref 11.7–15.4)
WBC: 5.1 10*3/uL (ref 3.4–10.8)

## 2019-12-05 LAB — PHENOBARBITAL LEVEL: Phenobarbital, Serum: 23 ug/mL (ref 15–40)

## 2020-02-18 ENCOUNTER — Other Ambulatory Visit: Payer: Self-pay | Admitting: Neurology

## 2020-02-18 NOTE — Telephone Encounter (Signed)
Patient was last seen in October, refill is appropriate

## 2020-02-27 IMAGING — MG DIGITAL SCREENING BILAT W/ TOMO W/ CAD
6 of 10 series · 6 of 30 positions shown · non-contrast
Comparison: Previous exam(s).

CLINICAL DATA: Screening.

EXAM:
DIGITAL SCREENING BILATERAL MAMMOGRAM WITH TOMO AND CAD

[R MLO synth-2D]
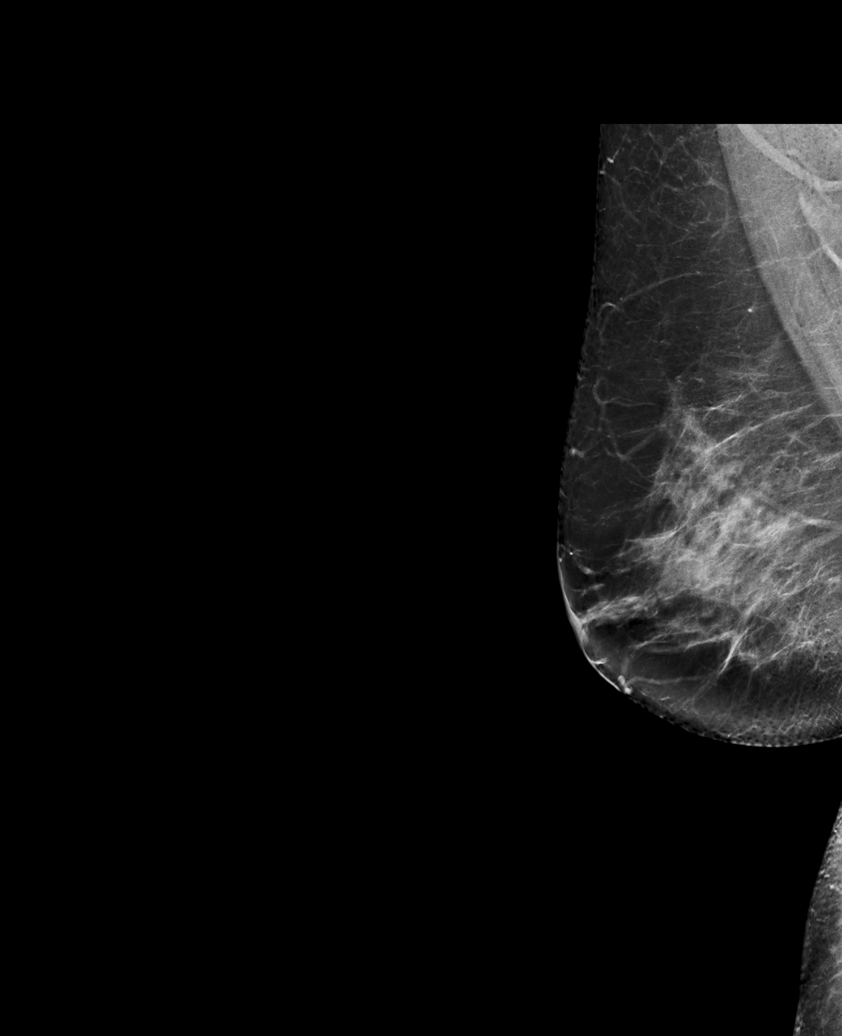

[R CC synth-2D]
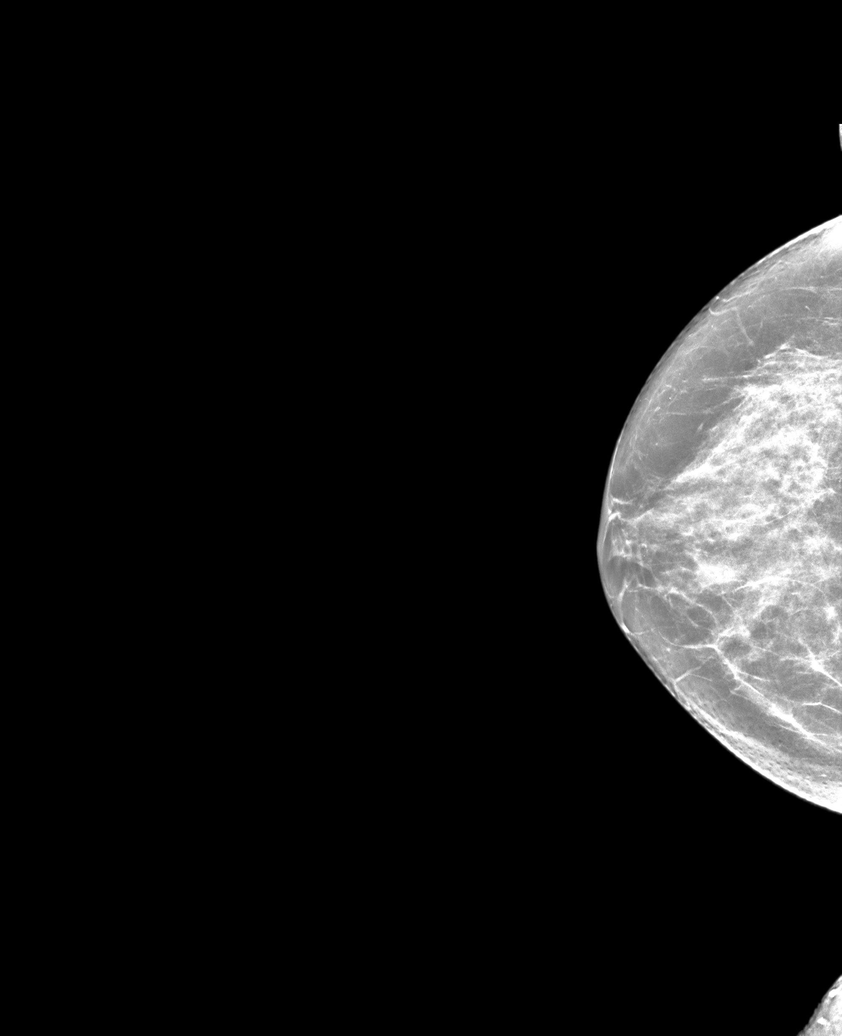

[L MLO synth-2D (1 of 2)]
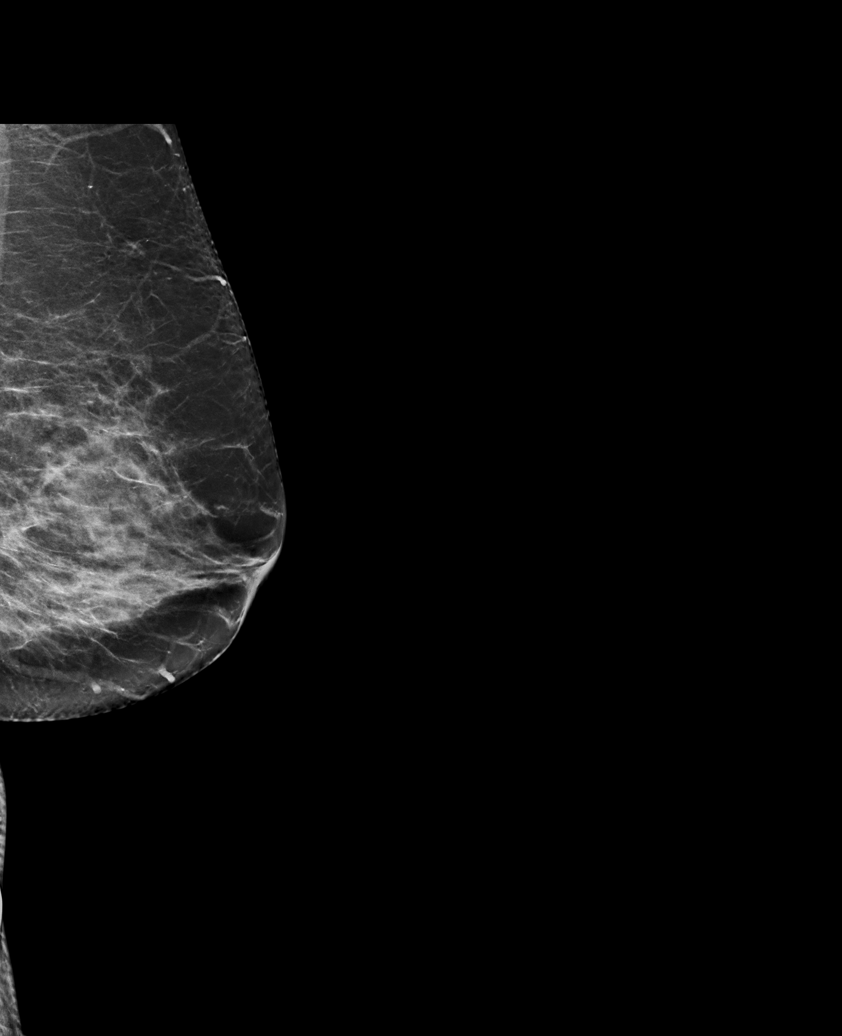

[L CC synth-2D]
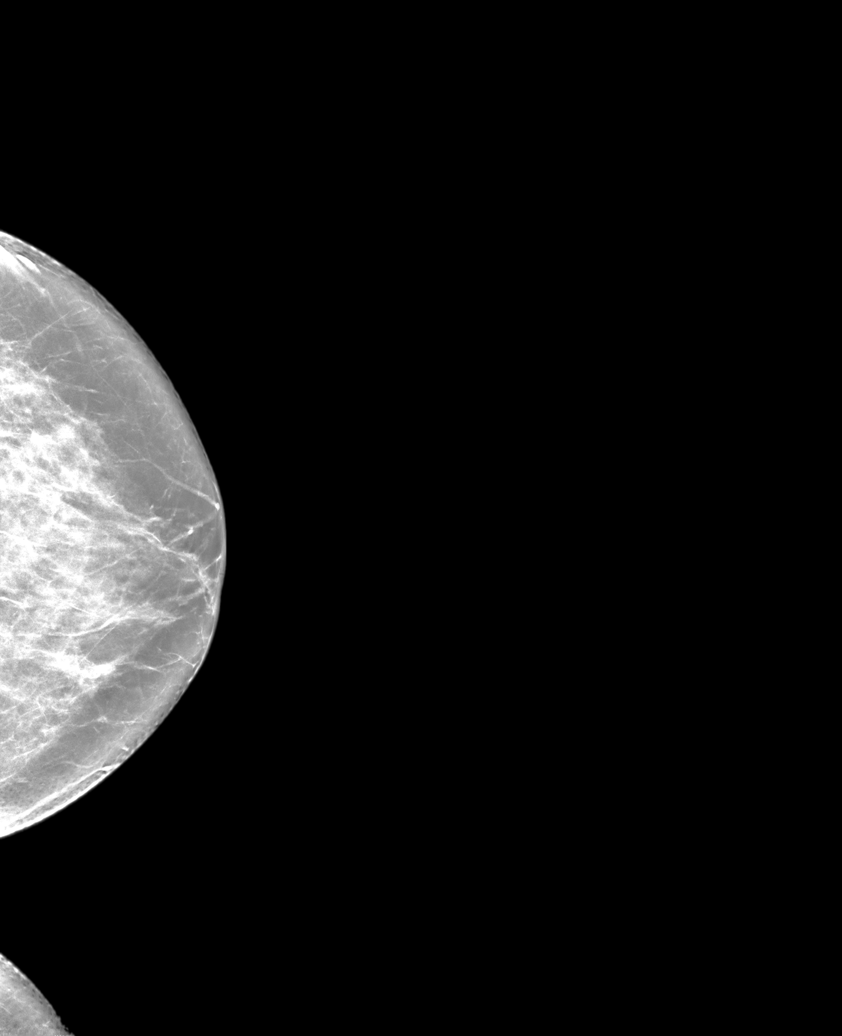

[L MLO synth-2D (2 of 2)]
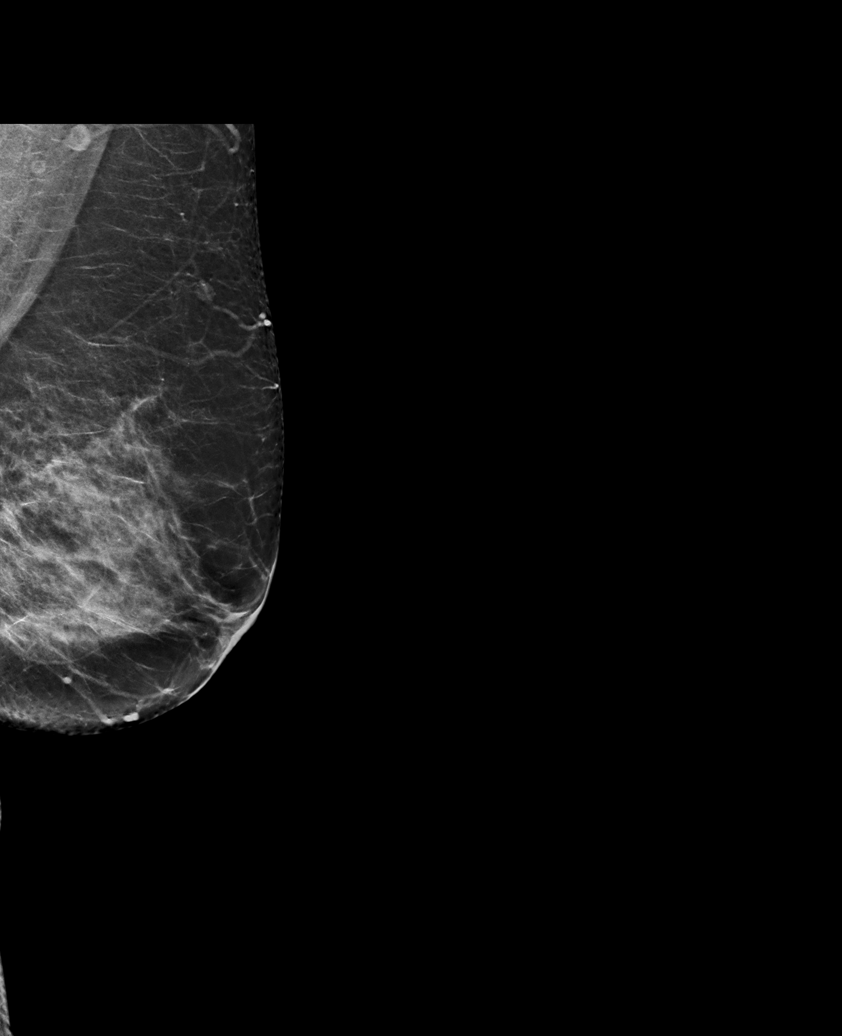

[L MLO tomo · tomo slice 37/74.0]
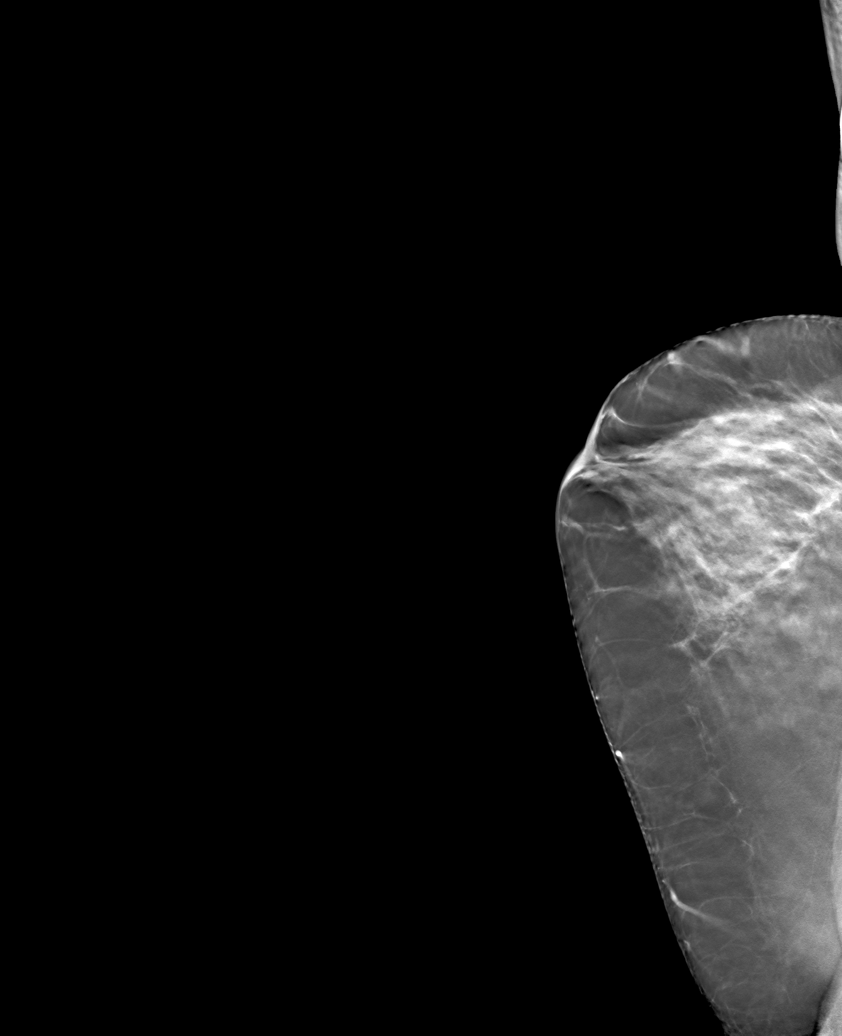

[6 of 30 positions shown; findings below may reference images not displayed]

ACR Breast Density Category c: The breast tissue is heterogeneously
dense, which may obscure small masses.
FINDINGS: There are no findings suspicious for malignancy. Images were
processed with CAD.
IMPRESSION: No mammographic evidence of malignancy. A result letter of this
screening mammogram will be mailed directly to the patient.

RECOMMENDATION:
Screening mammogram in one year. (Code:[id number]-U-LHB)

BI-RADS CATEGORY  1: Negative.

## 2020-08-17 ENCOUNTER — Other Ambulatory Visit: Payer: Self-pay | Admitting: Neurology

## 2020-12-03 ENCOUNTER — Ambulatory Visit: Payer: Self-pay | Admitting: Neurology

## 2020-12-03 ENCOUNTER — Encounter: Payer: Self-pay | Admitting: Neurology

## 2020-12-03 ENCOUNTER — Ambulatory Visit: Payer: BLUE CROSS/BLUE SHIELD | Admitting: Neurology

## 2020-12-03 VITALS — BP 132/70 | HR 53 | Ht 64.0 in | Wt 150.0 lb

## 2020-12-03 DIAGNOSIS — G40109 Localization-related (focal) (partial) symptomatic epilepsy and epileptic syndromes with simple partial seizures, not intractable, without status epilepticus: Secondary | ICD-10-CM | POA: Diagnosis not present

## 2020-12-03 DIAGNOSIS — Z5181 Encounter for therapeutic drug level monitoring: Secondary | ICD-10-CM

## 2020-12-03 NOTE — Progress Notes (Addendum)
Reason for visit: Seizures  [name] is an 60 y.o. female  History of present illness:  [name] is a 60 year old right-handed white female with a history of seizures.  The patient has been on phenobarbital she indicates since she was a child.  She cannot remember the last time she had a seizure, she has not had any blackouts since she has been an adult.  She did have blackouts in childhood.  She continues on phenobarbital in the evening, she tolerates this fairly well.  She does not operate a motor vehicle.  She comes here for further evaluation.  She indicates that no one has ever tried to taper her off of her phenobarbital.  Past Medical History:  Diagnosis Date   Allergy    Arthritis    Chicken pox    H/O echocardiogram 12/10/2008   Normal; EF55%   H/O exercise stress test 12/10/2008   Poor R-wave progression, ST @baseline , No ECG changes indicative of ischemia. Nomral HR recovery. No sx of ischemia. 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     Past Surgical History:  Procedure Laterality Date   OOPHORECTOMY Right 2006   x1 only with hysterectomy.    OVARIAN CYST REMOVAL  1994   PARTIAL HYSTERECTOMY  2006   Menorrhagia/fibroid    Family History  Problem Relation Age of Onset   Breast cancer Mother 44   COPD Father 94       Emphysema   Early death Father    Breast cancer Maternal Aunt 46    Social history:  reports that she has never smoked. She has never used smokeless tobacco. She reports that she does not drink alcohol and does not use drugs.   No Known Allergies  Medications:  Prior to Admission medications   Medication Sig Start Date End Date Taking? Authorizing Provider  PHENobarbital (LUMINAL) 64.8 MG tablet TAKE 2 TABLETS (129.6 MG TOTAL) BY MOUTH AT BEDTIME. 08/18/20   [name]    ROS:  Out of a complete 14 system review of symptoms, the  patient complains only of the following symptoms, and all other reviewed systems are negative.  History of seizures  Blood pressure 132/70, pulse (!) 53, height 5\' 4"  (1.626 m), weight 150 lb (68 kg), SpO2 98 %.  Physical Exam  General: The patient is alert and cooperative at the time of the examination.  Skin: No significant peripheral edema is noted.   Neurologic Exam  Mental status: The patient is alert and oriented x 3 at the time of the examination. The patient has apparent normal recent and remote memory, with an apparently normal attention span and concentration ability.   Cranial nerves: Facial symmetry is present. Speech is normal, no aphasia or dysarthria is noted. Extraocular movements are full. Visual fields are full.  Motor: The patient has good strength in all 4 extremities.  Sensory examination: Soft touch sensation is symmetric on the face, arms, and legs.  Coordination: The patient has good finger-nose-finger and heel-to-shin bilaterally.  Gait and station: The patient has a normal gait. Tandem gait is normal. Romberg is negative. No drift is seen.  Reflexes: Deep tendon reflexes are symmetric.   Assessment/Plan:  1.  History of seizures  The patient has been on a chronic dose of phenobarbital without any known seizures in many years.  I have discussed the possibility of tapering off her phenobarbital.  She does not operate a motor vehicle or engage in any other dangerous activities.  We will check an EEG study, we will consider a very slow taper off of her medications in the future if the EEG is unremarkable.  The patient will follow up here in 6 months.  Blood work will be done today. In the future, she can be followed through [name].  [name] 12/03/2020 10:27 AM  [facility] [address] [place], [place] [address]  Phone [phone] Fax [phone]

## 2020-12-04 LAB — CBC WITH DIFFERENTIAL/PLATELET
Basophils Absolute: 0 10*3/uL (ref 0.0–0.2)
Basos: 1 %
EOS (ABSOLUTE): 0.1 10*3/uL (ref 0.0–0.4)
Eos: 2 %
Hematocrit: 38.9 % (ref 34.0–46.6)
Hemoglobin: 12.9 g/dL (ref 11.1–15.9)
Immature Grans (Abs): 0 10*3/uL (ref 0.0–0.1)
Immature Granulocytes: 0 %
Lymphocytes Absolute: 1.6 10*3/uL (ref 0.7–3.1)
Lymphs: 34 %
MCH: 28.7 pg (ref 26.6–33.0)
MCHC: 33.2 g/dL (ref 31.5–35.7)
MCV: 86 fL (ref 79–97)
Monocytes Absolute: 0.3 10*3/uL (ref 0.1–0.9)
Monocytes: 7 %
Neutrophils Absolute: 2.7 10*3/uL (ref 1.4–7.0)
Neutrophils: 56 %
Platelets: 223 10*3/uL (ref 150–450)
RBC: 4.5 x10E6/uL (ref 3.77–5.28)
RDW: 12.6 % (ref 11.7–15.4)
WBC: 4.7 10*3/uL (ref 3.4–10.8)

## 2020-12-04 LAB — COMPREHENSIVE METABOLIC PANEL
ALT: 23 IU/L (ref 0–32)
AST: 23 IU/L (ref 0–40)
Albumin/Globulin Ratio: 1.6 (ref 1.2–2.2)
Albumin: 4.6 g/dL (ref 3.8–4.9)
Alkaline Phosphatase: 149 IU/L — ABNORMAL HIGH (ref 44–121)
BUN/Creatinine Ratio: 15 (ref 12–28)
BUN: 11 mg/dL (ref 8–27)
Bilirubin Total: 0.2 mg/dL (ref 0.0–1.2)
CO2: 25 mmol/L (ref 20–29)
Calcium: 9.2 mg/dL (ref 8.7–10.3)
Chloride: 104 mmol/L (ref 96–106)
Creatinine, Ser: 0.71 mg/dL (ref 0.57–1.00)
Globulin, Total: 2.9 g/dL (ref 1.5–4.5)
Glucose: 92 mg/dL (ref 70–99)
Potassium: 4.6 mmol/L (ref 3.5–5.2)
Sodium: 143 mmol/L (ref 134–144)
Total Protein: 7.5 g/dL (ref 6.0–8.5)
eGFR: 97 mL/min/{1.73_m2} (ref >59)

## 2020-12-04 LAB — PHENOBARBITAL LEVEL: Phenobarbital, Serum: 22 ug/mL (ref 15–40)

## 2020-12-08 ENCOUNTER — Ambulatory Visit (INDEPENDENT_AMBULATORY_CARE_PROVIDER_SITE_OTHER): Payer: BLUE CROSS/BLUE SHIELD | Admitting: Neurology

## 2020-12-08 DIAGNOSIS — G40109 Localization-related (focal) (partial) symptomatic epilepsy and epileptic syndromes with simple partial seizures, not intractable, without status epilepticus: Secondary | ICD-10-CM | POA: Diagnosis not present

## 2020-12-09 ENCOUNTER — Telehealth: Payer: Self-pay | Admitting: Neurology

## 2020-12-09 NOTE — Procedures (Signed)
    History:  [name] is a 60 year old patient with a history of seizures since childhood.  The patient has gone many decades without seizures, treated with phenobarbital.  She cannot remember the last time she had a seizure.  The patient is being evaluated for potential withdrawal of seizure medication.  This is a routine EEG.  No skull defects are noted.  Medications include phenobarbital.  EEG classification: Normal awake  Description of the recording: The background rhythms of this recording consists of a fairly well modulated medium amplitude alpha rhythm of 9 Hz that is reactive to eye opening and closure. As the record progresses, the patient appears to remain in the waking state throughout the recording. Photic stimulation was performed, resulting in a bilateral and symmetric photic driving response. Hyperventilation was also performed, resulting in a minimal buildup of the background rhythm activities without significant slowing seen. At no time during the recording does there appear to be evidence of spike or spike wave discharges or evidence of focal slowing. EKG monitor shows no evidence of cardiac rhythm abnormalities with a heart rate of 60.  Impression: This is a normal EEG recording in the waking state. No evidence of ictal or interictal discharges are seen.

## 2020-12-09 NOTE — Telephone Encounter (Signed)
I called the patient.  The EEG study was normal.  If the patient is amenable to it, we will initiate a taper very slowly off of the phenobarbital.  She is to contact our office if this is her desire.

## 2020-12-10 MED ORDER — PHENOBARBITAL 16.2 MG PO TABS: ORAL_TABLET | ORAL | 4 refills | Status: AC

## 2020-12-10 NOTE — Telephone Encounter (Signed)
Pt would like a call back to discuss how to go about weaning off of the PHENobarbital (LUMINAL) 64.8 MG tablet

## 2020-12-10 NOTE — Addendum Note (Signed)
Addended by: Kathrynn Ducking on: 12/10/2020 06:05 PM   Modules accepted: Orders

## 2020-12-17 ENCOUNTER — Telehealth: Payer: Self-pay | Admitting: Neurology

## 2020-12-17 NOTE — Telephone Encounter (Signed)
The patient will start taking the phenobarbital at nighttime only, no need to take it during the day.  She will taper slowly off the drug.

## 2020-12-17 NOTE — Telephone Encounter (Signed)
Pt called, question if need to take phenobarbital (LUMINAL) 16.2 MG tablet doing the day or at night. Would like a call from the nurse.

## 2021-06-01 ENCOUNTER — Telehealth: Payer: Self-pay

## 2021-06-01 NOTE — Telephone Encounter (Signed)
Received a request from pt's pharmacy about a refill on Phenobarbital. Per Dr. Jannifer Franklin note from 12/17/2020; December 17, 2020 ? ?The patient will start taking the phenobarbital at nighttime only, no need to take it during the day.  She will taper slowly off the drug.  ?  ?Denied refill to pharmacy and asked for pt to call us to discuss. ?

## 2021-06-09 ENCOUNTER — Encounter: Payer: Self-pay | Admitting: Neurology

## 2021-06-09 ENCOUNTER — Ambulatory Visit: Payer: BLUE CROSS/BLUE SHIELD | Admitting: Neurology

## 2021-06-09 VITALS — BP 121/69 | HR 68 | Ht 64.0 in | Wt 144.5 lb

## 2021-06-09 DIAGNOSIS — G40109 Localization-related (focal) (partial) symptomatic epilepsy and epileptic syndromes with simple partial seizures, not intractable, without status epilepticus: Secondary | ICD-10-CM

## 2021-06-09 NOTE — Progress Notes (Signed)
? ? ?Patient: [name] ?Date of Birth: [birth year] ? ?Reason for Visit: Follow up for seizures ?History from: Patient ?Primary Neurologist: Dr. Julius Bowels. April Manson  ? ?ASSESSMENT AND PLAN ?61 y.o. year old female  ? ?1.  History of seizures ? ?-[name] has done great with the dose reduction of phenobarbital ?-Continue to reduce dose of phenobarbital, will cut back to 1 tablet daily for 1 month, then stop ?-Monitor for any seizure activity, let us know, she does not drive a car, follow-up at our office on an as-needed basis ? ?HISTORY OF PRESENT ILLNESS: ?Today 06/09/21 ?[name] here today for follow-up.  EEG normal in October 2022. Has reduced the phenobarbital since last visit, is currently taking 16.2 mg 2 tablets daily, ready to cut back to 1. No seizures. Doesn't remember any seizures as an adult. Doesn't drive, her husband drives her. She stays at home.  ? ?HISTORY  ?12/03/2020 Dr. Jannifer Allison: [name] is a 61 year old right-handed white female with a history of seizures.  The patient has been on phenobarbital she indicates since she was a child.  She cannot remember the last time she had a seizure, she has not had any blackouts since she has been an adult.  She did have blackouts in childhood.  She continues on phenobarbital in the evening, she tolerates this fairly well.  She does not operate a motor vehicle.  She comes here for further evaluation.  She indicates that no one has ever tried to taper her off of her phenobarbital. ? ?REVIEW OF SYSTEMS: Out of a complete 14 system review of symptoms, the patient complains only of the following symptoms, and all other reviewed systems are negative. ? ?See HPI ? ?ALLERGIES: ?No Known Allergies ? ?HOME MEDICATIONS: ?Outpatient Medications Prior to Visit  ?Medication Sig Dispense Refill  ? phenobarbital (LUMINAL) 16.2 MG tablet Begin taking 7 tablets daily, then taper by 1 tablet daily every 4 weeks until off the medication 210 tablet 4  ? ?No facility-administered  medications prior to visit.  ? ? ?PAST MEDICAL HISTORY: ?Past Medical History:  ?Diagnosis Date  ? Allergy   ? Arthritis   ? Chicken pox   ? H/O echocardiogram 12/10/2008  ? Normal; EF55%  ? H/O exercise stress test 12/10/2008  ? Poor R-wave progression, ST '@baseline'$ , No ECG changes indicative of ischemia. Nomral HR recovery. No sx of ischemia. Dr. Irish Lack  ? 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 Follows with Neuro, present since infancy.   ? ? ?PAST SURGICAL HISTORY: ?Past Surgical History:  ?Procedure Laterality Date  ? OOPHORECTOMY Right 2006  ? x1 only with hysterectomy.   ? OVARIAN CYST REMOVAL  1994  ? PARTIAL HYSTERECTOMY  2006  ? Menorrhagia/fibroid  ? ? ?FAMILY HISTORY: ?Family History  ?Problem Relation Age of Onset  ? Breast cancer Mother 30  ? COPD Father 17  ?     Emphysema  ? Early death Father   ? Breast cancer Maternal Aunt 32  ? ? ?SOCIAL HISTORY: ?Social History  ? ?Socioeconomic History  ? Marital status: Married  ?  Spouse name: [name]   ? Number of children: 1  ? Years of education: hs  ? Highest education level: Not on file  ?Occupational History  ? Occupation: Homemaker  ?Tobacco Use  ? Smoking status: Never  ? Smokeless tobacco: Never  ?Vaping Use  ? Vaping Use: Never used  ?Substance and Sexual Activity  ? Alcohol use: No  ? Drug use:  No  ? Sexual activity: Never  ?  Partners: Male  ?Other Topics Concern  ? Not on file  ?Social History Narrative  ? Patient lives at home with husband [name], and 1 adult children.  ? Patient has a high school education. She is a Agricultural engineer.  ? Patient drinks about 3 cups of caffeine daily  ? She wears her seatbelt. Smoke detectors in the home.  ? Firearms locked in the home.  ? Feels safe in her relationships.  ? Right Handed  ?   ? ?Social Determinants of Health  ? ?Financial Resource Strain: Not on file  ?Food Insecurity: Not on file  ?Transportation Needs: Not on file  ?Physical Activity: Not on file  ?Stress: Not on file   ?Social Connections: Not on file  ?Intimate Partner Violence: Not on file  ? ? ?PHYSICAL EXAM ? ?Vitals:  ? 06/09/21 1108  ?BP: 121/69  ?Pulse: 68  ?Weight: 144 lb 8 oz (65.5 kg)  ?Height: '5\' 4"'$  (1.626 m)  ? ?Body mass index is 24.8 kg/m?. ? ?Generalized: Well developed, in no acute distress  ?Neurological examination  ?Mentation: Alert oriented to time, place, history taking. Follows all commands speech and language fluent ?Cranial nerve II-XII: Pupils were equal round reactive to light. Extraocular movements were full, visual field were full on confrontational test. Facial sensation and strength were normal.Head turning and shoulder shrug  were normal and symmetric. ?Motor: The motor testing reveals 5 over 5 strength of all 4 extremities. Good symmetric motor tone is noted throughout.  ?Sensory: Sensory testing is intact to soft touch on all 4 extremities. No evidence of extinction is noted.  ?Coordination: Cerebellar testing reveals good finger-nose-finger and heel-to-shin bilaterally.  ?Gait and station: Gait is normal. Tandem gait is normal.  ?Reflexes: Deep tendon reflexes are symmetric and normal bilaterally.  ? ?DIAGNOSTIC DATA (LABS, IMAGING, TESTING) ?- I reviewed patient records, labs, notes, testing and imaging myself where available. ? ?Lab Results  ?Component Value Date  ? WBC 4.7 12/03/2020  ? HGB 12.9 12/03/2020  ? HCT 38.9 12/03/2020  ? MCV 86 12/03/2020  ? PLT 223 12/03/2020  ? ?   ?Component Value Date/Time  ? NA 143 12/03/2020 1050  ? K 4.6 12/03/2020 1050  ? CL 104 12/03/2020 1050  ? CO2 25 12/03/2020 1050  ? GLUCOSE 92 12/03/2020 1050  ? GLUCOSE 105 (H) 10/24/2018 0905  ? BUN 11 12/03/2020 1050  ? CREATININE 0.71 12/03/2020 1050  ? CALCIUM 9.2 12/03/2020 1050  ? PROT 7.5 12/03/2020 1050  ? ALBUMIN 4.6 12/03/2020 1050  ? AST 23 12/03/2020 1050  ? ALT 23 12/03/2020 1050  ? ALKPHOS 149 (H) 12/03/2020 1050  ? BILITOT 0.2 12/03/2020 1050  ? GFRNONAA 89 12/04/2019 1047  ? GFRAA 103 12/04/2019 1047   ? ?Lab Results  ?Component Value Date  ? CHOL 209 (H) 10/24/2018  ? HDL 54.10 10/24/2018  ? LDLCALC 120 (H) 10/24/2018  ? TRIG 177.0 (H) 10/24/2018  ? CHOLHDL 4 10/24/2018  ? ?Lab Results  ?Component Value Date  ? HGBA1C 5.6 10/24/2018  ? ?No results found for: VITAMINB12 ?Lab Results  ?Component Value Date  ? TSH 4.13 10/24/2018  ? ? ?Butler Denmark, AGNP-C, DNP 06/09/2021, 11:32 AM ?[facility] ?[address], [address] ?[place], [place] [address] ?[phone] ? ?

## 2021-06-09 NOTE — Patient Instructions (Signed)
Cut back Phenobarbital to 1 tablet daily for 1 month then stop, call for any seizures or concerning symptoms ? ?
